# Patient Record
Sex: Female | Born: 1960 | Race: White | Hispanic: No | Marital: Married | Smoking: Former smoker
Health system: Southern US, Community
[De-identification: ages and names within clinical notes are randomized; demographics above are authoritative.]

## PROBLEM LIST (undated history)

## (undated) DIAGNOSIS — A389 Scarlet fever, uncomplicated: Secondary | ICD-10-CM

## (undated) DIAGNOSIS — Z460 Encounter for fitting and adjustment of spectacles and contact lenses: Secondary | ICD-10-CM

## (undated) DIAGNOSIS — E079 Disorder of thyroid, unspecified: Secondary | ICD-10-CM

## (undated) DIAGNOSIS — I10 Essential (primary) hypertension: Secondary | ICD-10-CM

## (undated) DIAGNOSIS — K219 Gastro-esophageal reflux disease without esophagitis: Secondary | ICD-10-CM

## (undated) DIAGNOSIS — M87243 Osteonecrosis due to previous trauma, unspecified hand: Secondary | ICD-10-CM

## (undated) DIAGNOSIS — E042 Nontoxic multinodular goiter: Secondary | ICD-10-CM

## (undated) HISTORY — DX: Essential (primary) hypertension: I10

## (undated) HISTORY — DX: Nontoxic multinodular goiter: E04.2

## (undated) HISTORY — DX: Scarlet fever, uncomplicated: A38.9

## (undated) HISTORY — DX: Gastro-esophageal reflux disease without esophagitis: K21.9

## (undated) HISTORY — DX: Disorder of thyroid, unspecified: E07.9

## (undated) HISTORY — DX: Osteonecrosis due to previous trauma, unspecified hand: M87.243

---

## 1999-02-26 ENCOUNTER — Encounter: Admission: RE | Admit: 1999-02-26 | Discharge: 1999-02-26 | Payer: Self-pay | Admitting: Family Medicine

## 1999-02-26 ENCOUNTER — Encounter: Payer: Self-pay | Admitting: Family Medicine

## 2001-01-22 ENCOUNTER — Ambulatory Visit (HOSPITAL_COMMUNITY): Admission: RE | Admit: 2001-01-22 | Discharge: 2001-01-22 | Payer: Self-pay | Admitting: Cardiology

## 2001-01-22 ENCOUNTER — Encounter: Payer: Self-pay | Admitting: Cardiology

## 2001-02-19 ENCOUNTER — Ambulatory Visit (HOSPITAL_COMMUNITY): Admission: RE | Admit: 2001-02-19 | Discharge: 2001-02-19 | Payer: Self-pay | Admitting: Cardiology

## 2001-02-19 ENCOUNTER — Encounter: Payer: Self-pay | Admitting: Cardiology

## 2003-11-14 ENCOUNTER — Encounter: Admission: RE | Admit: 2003-11-14 | Discharge: 2003-11-14 | Payer: Self-pay | Admitting: Family Medicine

## 2004-06-11 ENCOUNTER — Ambulatory Visit: Payer: Self-pay | Admitting: Family Medicine

## 2004-08-16 ENCOUNTER — Encounter: Admission: RE | Admit: 2004-08-16 | Discharge: 2004-08-16 | Payer: Self-pay | Admitting: Endocrinology

## 2004-12-10 ENCOUNTER — Ambulatory Visit: Payer: Self-pay | Admitting: Family Medicine

## 2005-02-14 ENCOUNTER — Encounter: Admission: RE | Admit: 2005-02-14 | Discharge: 2005-02-14 | Payer: Self-pay | Admitting: Endocrinology

## 2005-06-10 ENCOUNTER — Ambulatory Visit: Payer: Self-pay | Admitting: Family Medicine

## 2006-07-21 ENCOUNTER — Ambulatory Visit: Payer: Self-pay | Admitting: Family Medicine

## 2006-07-21 LAB — CONVERTED CEMR LAB
BUN: 11 mg/dL (ref 6–23)
Basophils Absolute: 0 10*3/uL (ref 0.0–0.1)
Chloride: 105 meq/L (ref 96–112)
Eosinophils Relative: 2 % (ref 0.0–5.0)
Lymphocytes Relative: 20.5 % (ref 12.0–46.0)
MCHC: 34.4 g/dL (ref 30.0–36.0)
MCV: 94.8 fL (ref 78.0–100.0)
Monocytes Absolute: 0.8 10*3/uL — ABNORMAL HIGH (ref 0.2–0.7)
Neutro Abs: 4.1 10*3/uL (ref 1.4–7.7)
RBC: 4.57 M/uL (ref 3.87–5.11)
Sodium: 142 meq/L (ref 135–145)
Total CHOL/HDL Ratio: 4.2
Triglycerides: 85 mg/dL (ref 0–149)

## 2006-07-31 ENCOUNTER — Encounter: Admission: RE | Admit: 2006-07-31 | Discharge: 2006-07-31 | Payer: Self-pay | Admitting: Family Medicine

## 2006-09-03 HISTORY — PX: TOTAL THYROIDECTOMY: SHX2547

## 2006-09-07 ENCOUNTER — Telehealth (INDEPENDENT_AMBULATORY_CARE_PROVIDER_SITE_OTHER): Payer: Self-pay | Admitting: *Deleted

## 2006-09-19 ENCOUNTER — Ambulatory Visit (HOSPITAL_COMMUNITY): Admission: RE | Admit: 2006-09-19 | Discharge: 2006-09-20 | Payer: Self-pay | Admitting: General Surgery

## 2006-09-19 ENCOUNTER — Encounter (INDEPENDENT_AMBULATORY_CARE_PROVIDER_SITE_OTHER): Payer: Self-pay | Admitting: General Surgery

## 2006-11-03 HISTORY — PX: WRIST SURGERY: SHX841

## 2006-11-22 ENCOUNTER — Other Ambulatory Visit: Payer: Self-pay | Admitting: Orthopedic Surgery

## 2006-11-23 ENCOUNTER — Other Ambulatory Visit: Payer: Self-pay | Admitting: Orthopedic Surgery

## 2006-11-23 ENCOUNTER — Encounter (INDEPENDENT_AMBULATORY_CARE_PROVIDER_SITE_OTHER): Payer: Self-pay | Admitting: Internal Medicine

## 2006-11-23 ENCOUNTER — Encounter (INDEPENDENT_AMBULATORY_CARE_PROVIDER_SITE_OTHER): Payer: Self-pay | Admitting: Orthopedic Surgery

## 2006-11-24 ENCOUNTER — Observation Stay (HOSPITAL_COMMUNITY): Admission: RE | Admit: 2006-11-24 | Discharge: 2006-11-25 | Payer: Self-pay | Admitting: Orthopedic Surgery

## 2006-11-24 ENCOUNTER — Other Ambulatory Visit: Payer: Self-pay | Admitting: Orthopedic Surgery

## 2006-11-28 ENCOUNTER — Ambulatory Visit: Payer: Self-pay | Admitting: Family Medicine

## 2006-11-29 ENCOUNTER — Encounter (INDEPENDENT_AMBULATORY_CARE_PROVIDER_SITE_OTHER): Payer: Self-pay | Admitting: Internal Medicine

## 2006-11-29 LAB — CONVERTED CEMR LAB
BUN: 8 mg/dL (ref 6–23)
CO2: 34 meq/L — ABNORMAL HIGH (ref 19–32)
Calcium: 9.1 mg/dL (ref 8.4–10.5)
Chloride: 101 meq/L (ref 96–112)
Creatinine, Ser: 0.9 mg/dL (ref 0.4–1.2)
GFR calc Af Amer: 87 mL/min
GFR calc non Af Amer: 72 mL/min
Glucose, Bld: 94 mg/dL (ref 70–99)
Sodium: 144 meq/L (ref 135–145)

## 2007-06-07 ENCOUNTER — Telehealth (INDEPENDENT_AMBULATORY_CARE_PROVIDER_SITE_OTHER): Payer: Self-pay | Admitting: Internal Medicine

## 2008-06-05 ENCOUNTER — Telehealth (INDEPENDENT_AMBULATORY_CARE_PROVIDER_SITE_OTHER): Payer: Self-pay | Admitting: Internal Medicine

## 2008-06-14 IMAGING — CR DG CHEST 2V
2 series · 2 of 2 positions shown · non-contrast
Comparison: None available.

CLINICAL DATA: 46-year-old female preop evaluation for multinodular thyroid.  
 CHEST - 2 VIEW:

[w chest pa]
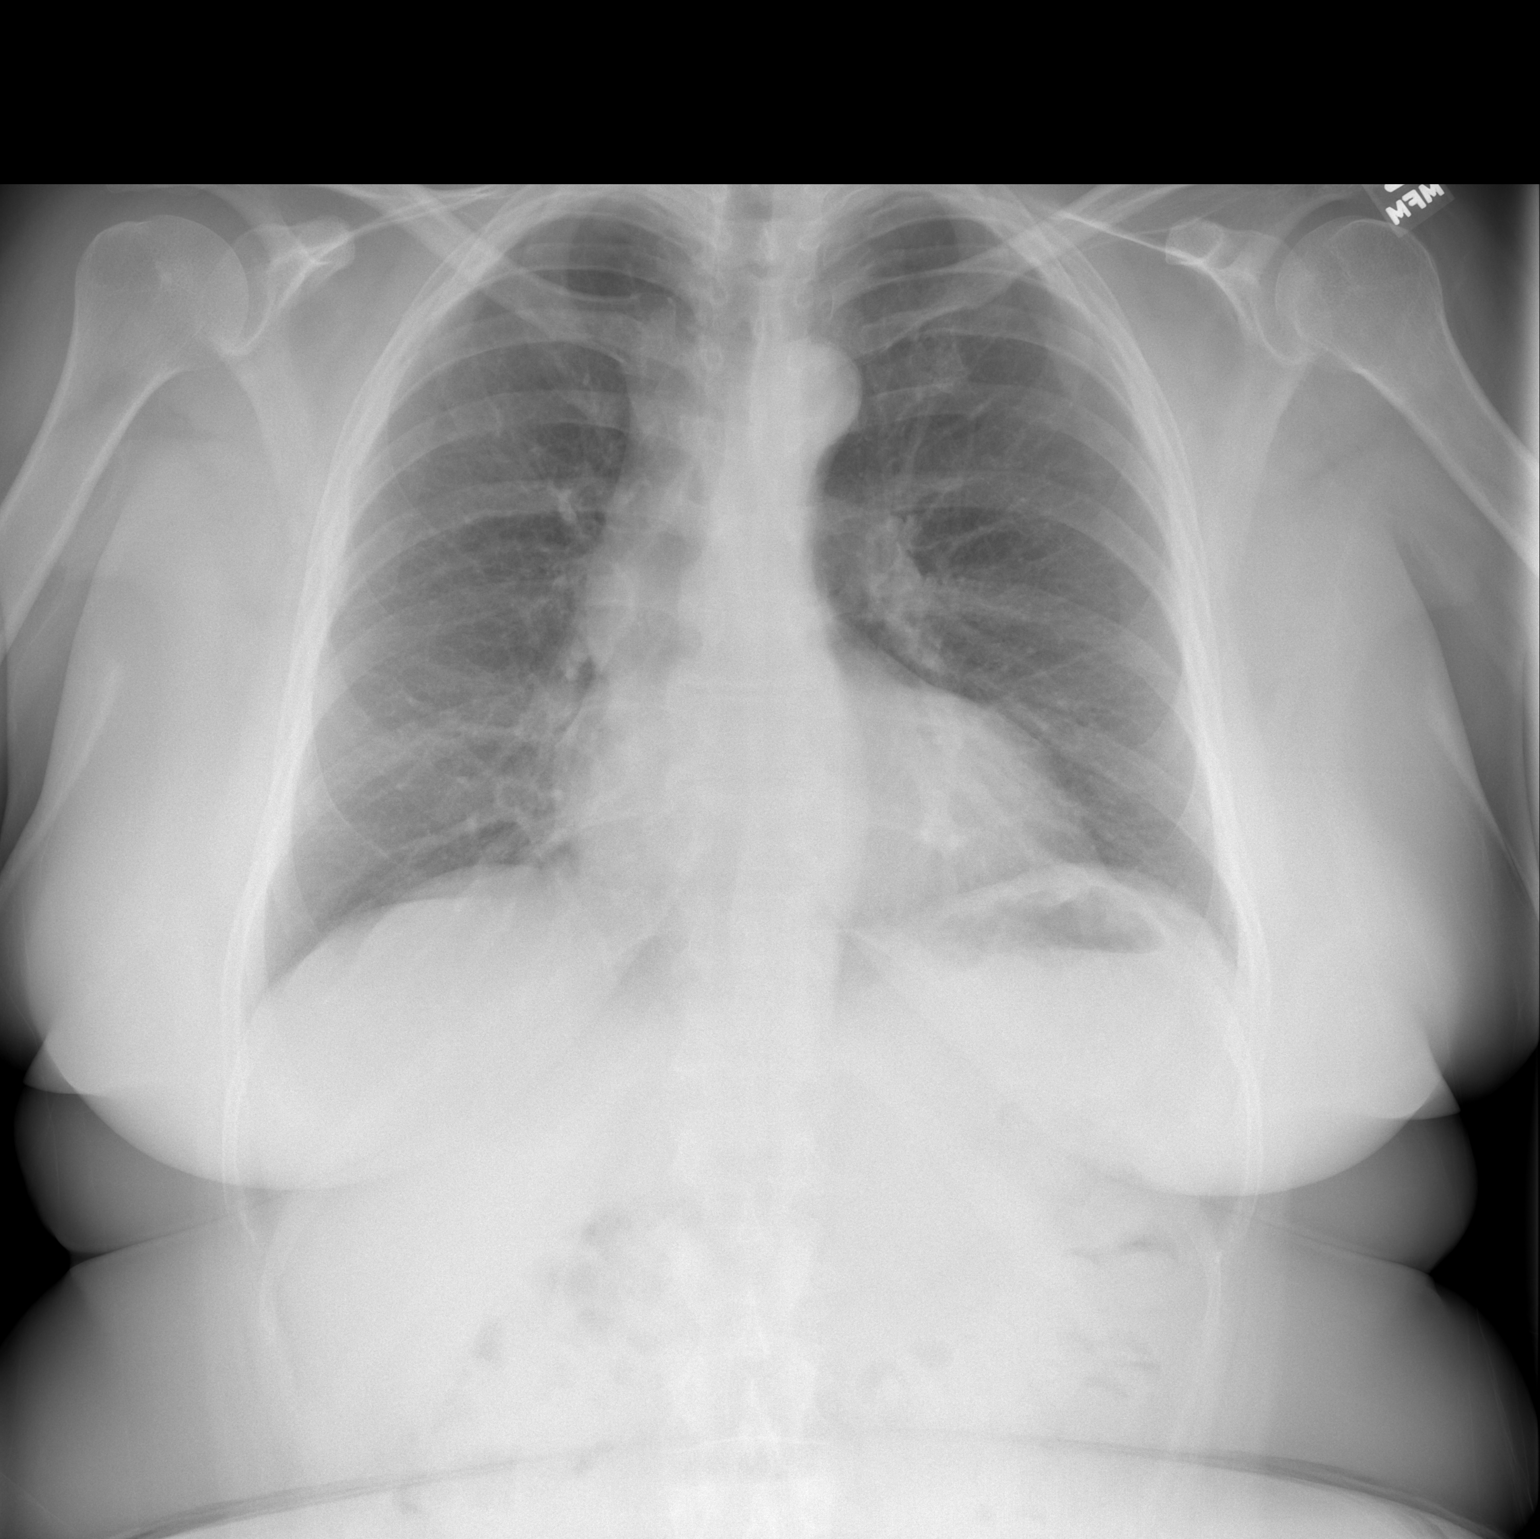

[w chest lat]
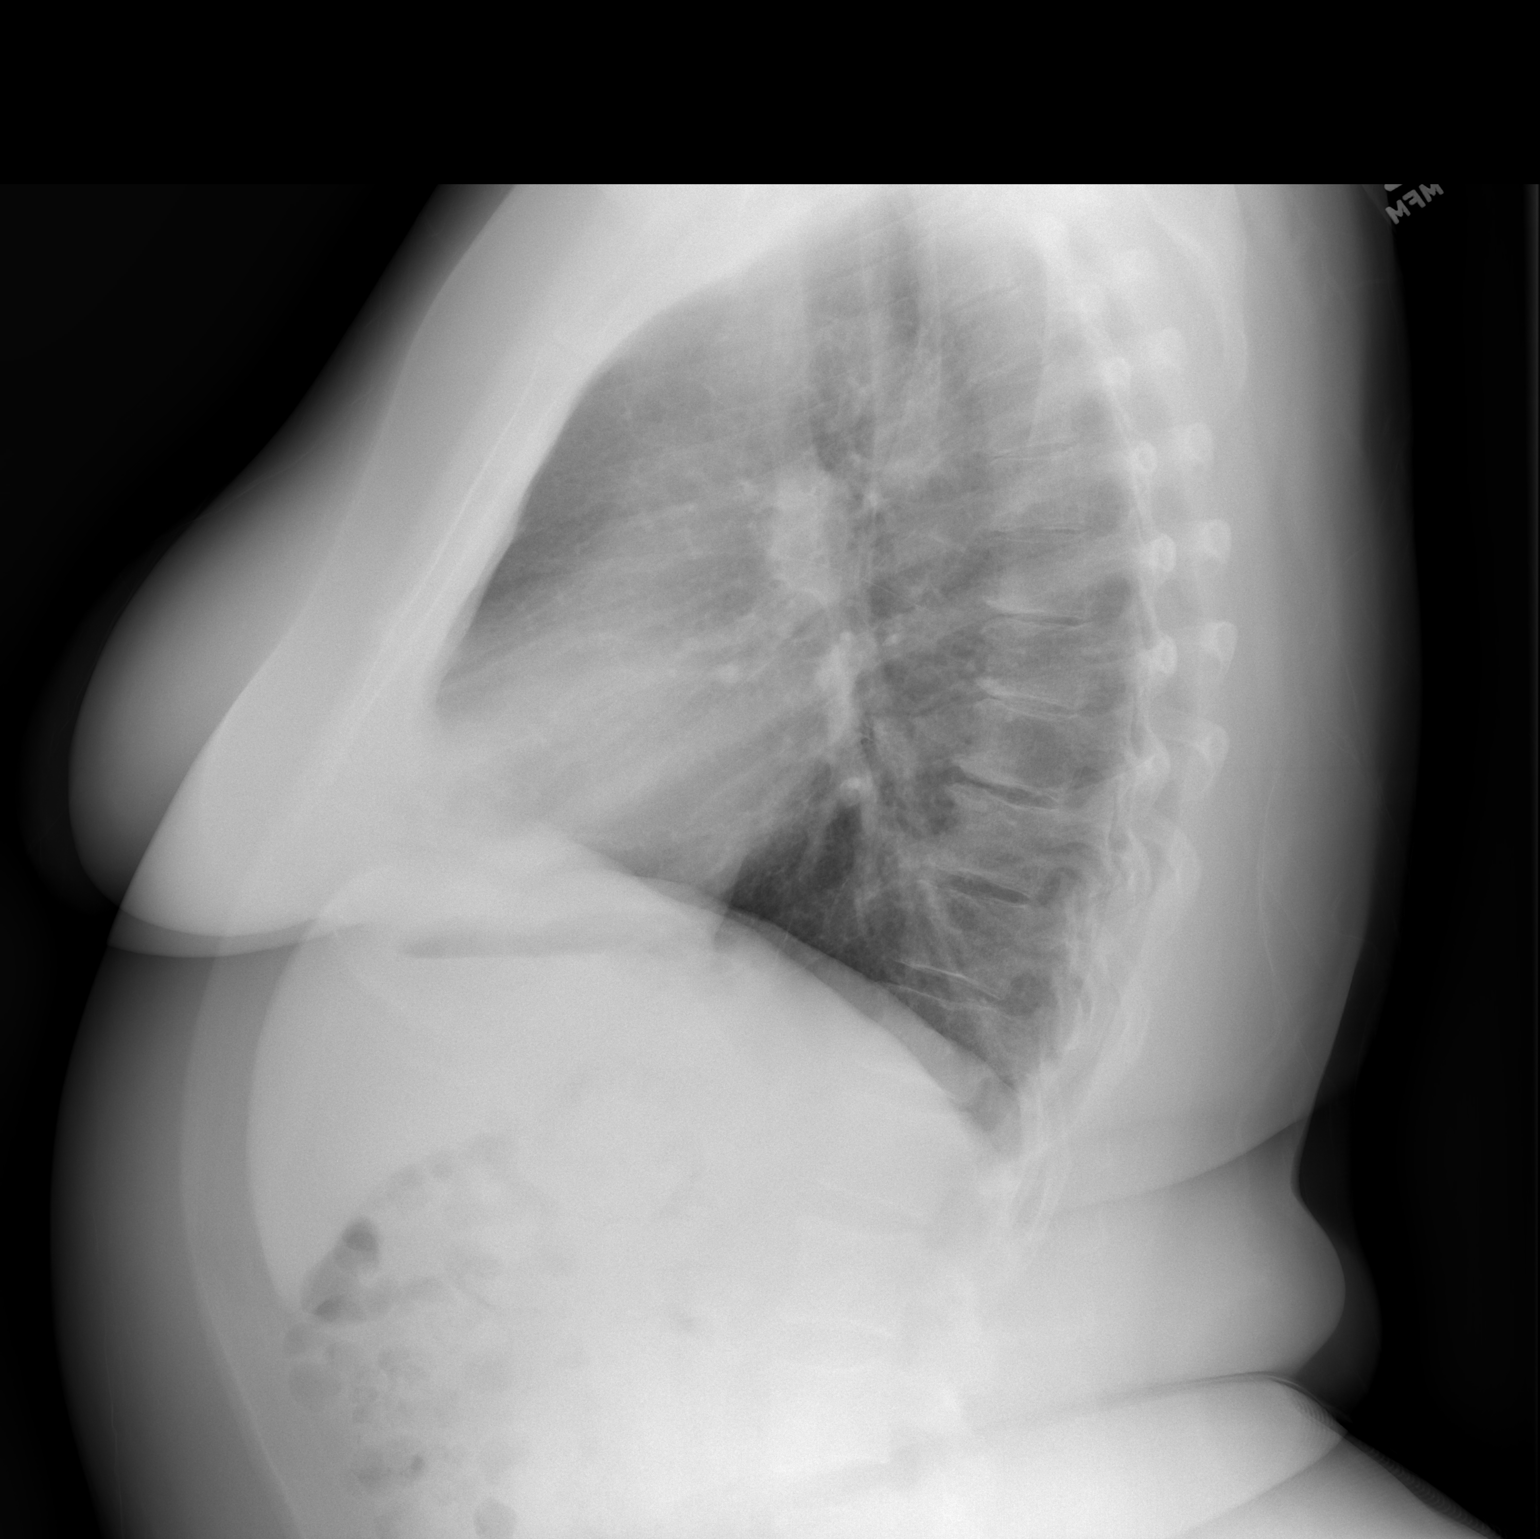

[2 of 2 positions shown; findings below may reference images not displayed]

FINDINGS: Normal heart size.  No acute pneumonia, CHF, effusion or pneumothorax.  Right paratracheal prominence is likely related to rotation and vascularity.  
 Thoracic spondylosis is noted.
IMPRESSION: No acute chest process.

## 2008-06-18 ENCOUNTER — Ambulatory Visit: Payer: Self-pay | Admitting: Family Medicine

## 2008-06-18 DIAGNOSIS — I10 Essential (primary) hypertension: Secondary | ICD-10-CM | POA: Insufficient documentation

## 2008-07-18 ENCOUNTER — Encounter (INDEPENDENT_AMBULATORY_CARE_PROVIDER_SITE_OTHER): Payer: Self-pay | Admitting: Internal Medicine

## 2008-07-18 ENCOUNTER — Other Ambulatory Visit: Admission: RE | Admit: 2008-07-18 | Discharge: 2008-07-18 | Payer: Self-pay | Admitting: Family Medicine

## 2008-07-18 ENCOUNTER — Ambulatory Visit: Payer: Self-pay | Admitting: Family Medicine

## 2008-07-18 DIAGNOSIS — E669 Obesity, unspecified: Secondary | ICD-10-CM | POA: Insufficient documentation

## 2008-08-13 ENCOUNTER — Ambulatory Visit: Payer: Self-pay | Admitting: Gastroenterology

## 2008-08-22 ENCOUNTER — Ambulatory Visit: Payer: Self-pay | Admitting: Internal Medicine

## 2008-08-26 LAB — CONVERTED CEMR LAB: Potassium: 3.7 meq/L (ref 3.5–5.3)

## 2008-08-27 ENCOUNTER — Ambulatory Visit: Payer: Self-pay | Admitting: Gastroenterology

## 2008-08-27 ENCOUNTER — Encounter: Payer: Self-pay | Admitting: Gastroenterology

## 2008-08-28 ENCOUNTER — Encounter: Payer: Self-pay | Admitting: Gastroenterology

## 2008-08-29 ENCOUNTER — Encounter: Admission: RE | Admit: 2008-08-29 | Discharge: 2008-08-29 | Payer: Self-pay | Admitting: Family Medicine

## 2008-09-03 ENCOUNTER — Encounter (INDEPENDENT_AMBULATORY_CARE_PROVIDER_SITE_OTHER): Payer: Self-pay | Admitting: *Deleted

## 2008-10-08 ENCOUNTER — Telehealth (INDEPENDENT_AMBULATORY_CARE_PROVIDER_SITE_OTHER): Payer: Self-pay | Admitting: Internal Medicine

## 2008-10-20 ENCOUNTER — Ambulatory Visit: Payer: Self-pay | Admitting: Gastroenterology

## 2008-11-07 ENCOUNTER — Ambulatory Visit: Payer: Self-pay | Admitting: Family Medicine

## 2008-11-20 ENCOUNTER — Telehealth: Payer: Self-pay | Admitting: Gastroenterology

## 2009-02-25 ENCOUNTER — Ambulatory Visit: Payer: Self-pay | Admitting: Family Medicine

## 2009-02-25 LAB — CONVERTED CEMR LAB
BUN: 10 mg/dL (ref 6–23)
CO2: 31 meq/L (ref 19–32)
Calcium: 8.8 mg/dL (ref 8.4–10.5)
Chloride: 102 meq/L (ref 96–112)
Cholesterol: 184 mg/dL (ref 0–200)
Creatinine, Ser: 0.9 mg/dL (ref 0.4–1.2)
GFR calc non Af Amer: 71.09 mL/min (ref >60)
Glucose, Bld: 95 mg/dL (ref 70–99)
HDL: 43.5 mg/dL (ref >39.00)
LDL Cholesterol: 126 mg/dL — ABNORMAL HIGH (ref 0–99)
Potassium: 3.5 meq/L (ref 3.5–5.1)
Sodium: 142 meq/L (ref 135–145)
Total CHOL/HDL Ratio: 4
Triglycerides: 73 mg/dL (ref 0.0–149.0)
VLDL: 14.6 mg/dL (ref 0.0–40.0)

## 2009-09-03 ENCOUNTER — Ambulatory Visit: Payer: Self-pay | Admitting: Family Medicine

## 2009-09-03 DIAGNOSIS — R5381 Other malaise: Secondary | ICD-10-CM

## 2009-09-03 DIAGNOSIS — R5383 Other fatigue: Secondary | ICD-10-CM

## 2009-09-03 DIAGNOSIS — K219 Gastro-esophageal reflux disease without esophagitis: Secondary | ICD-10-CM

## 2009-09-03 DIAGNOSIS — E039 Hypothyroidism, unspecified: Secondary | ICD-10-CM | POA: Insufficient documentation

## 2009-09-03 LAB — CONVERTED CEMR LAB
ALT: 20 units/L (ref 0–35)
BUN: 15 mg/dL (ref 6–23)
Basophils Absolute: 0 10*3/uL (ref 0.0–0.1)
Basophils Relative: 0.7 % (ref 0.0–3.0)
Bilirubin, Direct: 0.2 mg/dL (ref 0.0–0.3)
CO2: 33 meq/L — ABNORMAL HIGH (ref 19–32)
Calcium: 9.1 mg/dL (ref 8.4–10.5)
Chloride: 103 meq/L (ref 96–112)
Creatinine, Ser: 0.9 mg/dL (ref 0.4–1.2)
Eosinophils Relative: 6.4 % — ABNORMAL HIGH (ref 0.0–5.0)
Glucose, Bld: 93 mg/dL (ref 70–99)
Hemoglobin: 14.3 g/dL (ref 12.0–15.0)
MCV: 96.2 fL (ref 78.0–100.0)
Neutro Abs: 3.6 10*3/uL (ref 1.4–7.7)
Platelets: 341 10*3/uL (ref 150.0–400.0)
Potassium: 4 meq/L (ref 3.5–5.1)
Total Bilirubin: 0.8 mg/dL (ref 0.3–1.2)
Total Protein: 6.8 g/dL (ref 6.0–8.3)

## 2010-04-07 ENCOUNTER — Ambulatory Visit
Admission: RE | Admit: 2010-04-07 | Discharge: 2010-04-07 | Payer: Self-pay | Source: Home / Self Care | Attending: Family Medicine | Admitting: Family Medicine

## 2010-04-21 ENCOUNTER — Ambulatory Visit
Admission: RE | Admit: 2010-04-21 | Discharge: 2010-04-21 | Payer: Self-pay | Source: Home / Self Care | Attending: Family Medicine | Admitting: Family Medicine

## 2010-04-21 ENCOUNTER — Other Ambulatory Visit: Payer: Self-pay | Admitting: Family Medicine

## 2010-04-22 LAB — BASIC METABOLIC PANEL
BUN: 21 mg/dL (ref 6–23)
CO2: 29 mEq/L (ref 19–32)
Calcium: 8.8 mg/dL (ref 8.4–10.5)
Chloride: 98 mEq/L (ref 96–112)
Creatinine, Ser: 0.8 mg/dL (ref 0.4–1.2)
GFR: 83.45 mL/min (ref >60.00)
Glucose, Bld: 79 mg/dL (ref 70–99)
Potassium: 3.7 mEq/L (ref 3.5–5.1)
Sodium: 135 mEq/L (ref 135–145)

## 2010-04-22 LAB — LDL CHOLESTEROL, DIRECT: Direct LDL: 122.4 mg/dL

## 2010-04-27 ENCOUNTER — Encounter
Admission: RE | Admit: 2010-04-27 | Discharge: 2010-04-27 | Payer: Self-pay | Source: Home / Self Care | Attending: Family Medicine | Admitting: Family Medicine

## 2010-04-27 LAB — HM MAMMOGRAPHY

## 2010-04-29 ENCOUNTER — Encounter (INDEPENDENT_AMBULATORY_CARE_PROVIDER_SITE_OTHER): Payer: Self-pay | Admitting: *Deleted

## 2010-05-02 LAB — CONVERTED CEMR LAB
ALT: 17 units/L (ref 0–35)
BUN: 14 mg/dL (ref 6–23)
Basophils Absolute: 0 10*3/uL (ref 0.0–0.1)
Basophils Relative: 1 % (ref 0–1)
Chloride: 100 meq/L (ref 96–112)
Cholesterol: 177 mg/dL (ref 0–200)
Eosinophils Absolute: 0.1 10*3/uL (ref 0.0–0.7)
Eosinophils Relative: 1 % (ref 0–5)
HDL: 46 mg/dL (ref >39)
MCV: 93.3 fL (ref 78.0–100.0)
Monocytes Absolute: 1 10*3/uL (ref 0.1–1.0)
Monocytes Relative: 11 % (ref 3–12)
Platelets: 402 10*3/uL — ABNORMAL HIGH (ref 150–400)
Potassium: 3.4 meq/L — ABNORMAL LOW (ref 3.5–5.3)
Sodium: 140 meq/L (ref 135–145)
Total Bilirubin: 0.6 mg/dL (ref 0.3–1.2)
VLDL: 21 mg/dL (ref 0–40)
WBC: 8.8 10*3/uL (ref 4.0–10.5)

## 2010-05-04 NOTE — Assessment & Plan Note (Signed)
Summary: F/UP / LFW R/S FROM 08/31/09   Vital Signs:  Patient profile:   50 year old [place] Height:      61 inches Weight:      199.4 pounds BMI:     37.81 Temp:     98.4 degrees F oral Pulse rate:   60 / minute Pulse rhythm:   regular BP sitting:   100 / 70  (left arm) Cuff size:   regular  Vitals Entered By: Benny Lennert CMA Duncan Dull) (September 03, 2009 8:39 AM)  History of Present Illness: Chief complaint 6 month follow up  Hypothyroid: seeing endocrinology  HTN: stable on current meds, no se  GERD: rare kapidex use, doing fine  mammography - first one last, going to schedule upcoming  Ankle pain, multiyear history, some swelling, on feet 50+ hours a day    Allergies: 1)  * Penicillin 2)  * Sulfa  Past History:  Past medical, surgical, family and social histories (including risk factors) reviewed, and no changes noted (except as noted below).  Past Medical History: Hypertension Scarlet fever as a child GOITER, NONTOXIC MULTINODULAR (ICD-241.1) Hypothyroidism GERD  Past Surgical History: Reviewed history from 08/13/2008 and no changes required. 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 Adela Lank  Family History: Reviewed history from 08/13/2008 and no changes required. Family History of Heart Disease: Mother No FH of Colon Cancer: Family History of Diabetes: Grandmother MI: Mother Lung Cancer: Aunt  Social History: Reviewed history from 08/13/2008 and no changes required. 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  Review of Systems       REVIEW OF SYSTEMS GEN: No acute illnesses, no fever, chills, sweats. CV: No chest pain or SOB GI: No noted N or V Otherwise, pertinent positives and  negatives are noted in the HPI.   Physical Exam  General:  Well-developed,well-nourished,in no acute distress; alert,appropriate and cooperative throughout examination Head:  Normocephalic and atraumatic without obvious abnormalities. No apparent alopecia or balding. Ears:  no external deformities.   Nose:  no external deformity.   Mouth:  Oral mucosa and oropharynx without lesions or exudates.  Teeth in good repair. Neck:  No deformities, masses, or tenderness noted. Lungs:  Normal respiratory effort, chest expands symmetrically. Lungs are clear to auscultation, no crackles or wheezes. Heart:  Normal rate and regular rhythm. S1 and S2 normal without gallop, murmur, click, rub or other extra sounds. Neurologic:  alert & oriented X3 and gait normal.   Cervical Nodes:  No lymphadenopathy noted Psych:  Cognition and judgment appear intact. Alert and cooperative with normal attention span and concentration. No apparent delusions, illusions, hallucinations   Foot/Ankle Exam  Gait:    Normal heel-toe gait pattern bilaterally.    Inspection:    minimal lateral swelling  Palpation:    non-tender to palpation over distal leg, medial and lateral ankle, distal achilles tendon, medial and lateral hindfoot, posterior heel, plantar heel, midfoot and forefoot bilaterally.    Impression & Recommendations:  Problem # 1:  HYPERTENSION (ICD-401.9) Assessment Unchanged  Her updated medication list for this problem includes:    Furosemide 20 Mg Tabs (Furosemide) ..... One by mouth daily    Metoprolol Tartrate 50 Mg Tabs (Metoprolol tartrate) .Marland Kitchen... 1 two times a day  for bp    Diovan Hct 160-12.5 Mg Tabs (Valsartan-hydrochlorothiazide) .Marland Kitchen... Take 1 each morning for bp  BP today: 100/70 Prior BP: 102/64 (02/25/2009)  Labs Reviewed: K+: 3.5 (02/25/2009) Creat: : 0.9 (02/25/2009)   Chol: 184 (02/25/2009)   HDL: 43.50 (02/25/2009)   LDL: 126 (02/25/2009)   TG: 73.0 (02/25/2009)  Problem # 2:  GERD  (ICD-530.81) Assessment: Unchanged  Her updated medication list for this problem includes:    Kapidex 60 Mg Cpdr (Dexlansoprazole) ..... One tablet by mouth once daily as needed  Problem # 3:  HYPOTHYROIDISM (ICD-244.9)  Her updated medication list for this problem includes:    Synthroid 112 Mcg Tabs (Levothyroxine sodium) .Marland Kitchen... Take one by mouth daily  Problem # 4:  ANKLE, PAIN (ICD-719.47) multiyear hist, s/p xr, mri with no significant derangement nonfocal exam basic cushioning as below  Complete Medication List: 1)  Furosemide 20 Mg Tabs (Furosemide) .... One by mouth daily 2)  Metoprolol Tartrate 50 Mg Tabs (Metoprolol tartrate) .Marland Kitchen.. 1 two times a day for bp 3)  Synthroid 112 Mcg Tabs (Levothyroxine sodium) .... Take one by mouth daily 4)  Kapidex 60 Mg Cpdr (Dexlansoprazole) .... One tablet by mouth once daily as needed 5)  Diovan Hct 160-12.5 Mg Tabs (Valsartan-hydrochlorothiazide) .... Take 1 each morning for bp 6)  Tylenol 325 Mg Tabs (Acetaminophen) .... Otc as directed.  Other Orders: Venipuncture ([address]) TLB-BMP (Basic Metabolic Panel-BMET) (80048-METABOL) TLB-CBC Platelet - w/Differential (85025-CBCD) TLB-Hepatic/Liver Function Pnl (80076-HEPATIC) T-Vitamin D (Tiffany-Hydroxy) (60454-09811)  Patient Instructions: 1)  Excellent Over the Counter Orthotics: 2)  Hapad: available at www.hapad.com 3)  SPENCO: Available at some sports stores or www.amazon.com 4)  SHOES: Danskos, Merrells, Keens, Clarks - good arch support, want minimal bendability 5)  Shoes: Birkenstock shoes, [employer] 6)  ***[employer], [address] [place], [place]***  7)  f/u 6 mo for CPX Prescriptions: DIOVAN HCT 160-12.5 MG TABS (VALSARTAN-HYDROCHLOROTHIAZIDE) take 1 each morning for BP  #30 Tablet x 11   Entered and Authorized by:   Hannah Beat MD   Signed by:   Hannah Beat MD on 09/03/2009   Method used:   Electronically to        [facility] [address] [id number]       [address]       [place], [place]  [address]       Ph: [phone]       Fax: [phone]   RxID:   [id number] METOPROLOL TARTRATE 50 MG  TABS (METOPROLOL TARTRATE) 1 two times a day for BP  #60 Tablet x 11   Entered and Authorized by:   Hannah Beat MD   Signed by:   Hannah Beat MD on 09/03/2009   Method used:   [facility] to        [facility] [address] [phone][facility] (retail)       [address]       [place], [place]  [address]       Ph: [phone]       Fax: [phone]   RxID:   [id number] FUROSEMIDE 20 MG TABS (FUROSEMIDE) one by mouth daily  #30 Tablet x 11   Entered and Authorized by:   Hannah Beat MD   Signed by:   Hannah Beat MD on 09/03/2009   Method used:   [facility]  [facility] [address] [id number]       [address]       [place], [place]  [address]       Ph: [phone]       Fax: [phone]   RxID:   [id number]   Current Allergies (reviewed today): * PENICILLIN * SULFA  Appended Document: F/UP / LFW R/S FROM 08/31/09

## 2010-05-06 NOTE — Letter (Signed)
Summary: Results Follow up Letter  [place] [facility]  [address] [place], [place] [address]   Phone: [phone]  Fax: [phone]    04/29/2010 MRN: [id number]     Gesselle Jablonowski [address] [place], [place]  [address]    Dear Ms. Barry Dienes,  The following are the results of your recent test(s):  Test         Result    Pap Smear:        Normal _____  Not Normal _____ Comments: ______________________________________________________ Cholesterol: LDL(Bad cholesterol):         Your goal is less than:         HDL (Good cholesterol):       Your goal is more than: Comments:  ______________________________________________________ Mammogram:        Normal __x___  Not Normal _____ Comments:Repeat in 1 year  ___________________________________________________________________ Hemoccult:        Normal _____  Not normal _______ Comments:    _____________________________________________________________________ Other Tests:    We routinely do not discuss normal results over the telephone.  If you desire a copy of the results, or you have any questions about this information we can discuss them at your next office visit.   Sincerely,  Hannah Beat MD

## 2010-05-06 NOTE — Assessment & Plan Note (Signed)
Summary: HEAD STOPPED UP, COUGH, HEADACHES / LFW   Vital Signs:  Patient profile:   50 year old female Weight:      205.50 pounds O2 Sat:      99 % on Room air Temp:     99.4 degrees F oral Pulse rate:   60 / minute Pulse rhythm:   regular Resp:     20 per minute BP sitting:   108 / 70  (left arm) Cuff size:   large  Vitals Entered By: Sydell Axon LPN (April 07, 2010 2:36 PM)  O2 Flow:  Room air CC: Head is stopped up, deep cough and headache   History of Present Illness: Pt here foir acute dz. She has had warmth and chills a few days ago that is now improved. She has hewadache in the top of the head. Cough worsens this. No ear pain, some rhinitis that is continuous and clear with min yellow and some sneezing that has improved, no ST now, did hurt with coughing but better, now coughing some with proiductive yellow sputum. She has taken ALKA Selzer Cold and Cough, Aleve and Halls, Mucinex last night and today.  Allergies: 1)  * Penicillin 2)  * Sulfa  Physical Exam  General:  Well-developed,well-nourished,in no acute distress; alert,appropriate and cooperative throughout examination, mildly congested and mildly hoarse. Head:  Normocephalic and atraumatic without obvious abnormalities. Sinuses NT. Eyes:  Conjunctiva clear bilaterally.  Ears:  External ear exam shows no significant lesions or deformities.  Otoscopic examination reveals clear canals, tympanic membranes are intact bilaterally without bulging, retraction, inflammation or discharge. Hearing is grossly normal bilaterally. Nose:  External nasal examination shows no deformity or inflammation. Nasal mucosa are pink and moist without lesions or exudates. Mild swelling with clear discharge bilat. Mouth:  Oral mucosa and oropharynx without lesions or exudates.  Teeth in good repair. Min clear PND. Neck:  No deformities, masses, or tenderness noted. Lungs:  Normal respiratory effort, chest expands symmetrically. Lungs are  clear to auscultation, no crackles or wheezes. Heart:  Normal rate and regular rhythm. S1 and S2 normal without gallop, murmur, click, rub or other extra sounds.   Impression & Recommendations:  Problem # 1:  URI (ICD-465.9) Assessment New  See instructions. Her updated medication list for this problem includes:    Tylenol 325 Mg Tabs (Acetaminophen) ..... Otc as directed.  Instructed on symptomatic treatment. Call if symptoms persist or worsen.   Complete Medication List: 1)  Furosemide 20 Mg Tabs (Furosemide) .... One by mouth daily 2)  Metoprolol Tartrate 50 Mg Tabs (Metoprolol tartrate) .Marland Kitchen.. 1 two times a day for bp 3)  Synthroid 112 Mcg Tabs (Levothyroxine sodium) .... Take one by mouth daily 4)  Kapidex 60 Mg Cpdr (Dexlansoprazole) .... One tablet by mouth once daily as needed 5)  Diovan Hct 160-12.5 Mg Tabs (Valsartan-hydrochlorothiazide) .... Take 1 each morning for bp 6)  Tylenol 325 Mg Tabs (Acetaminophen) .... Otc as directed. 7)  Vitamin D (ergocalciferol) 50000 Unit Caps (Ergocalciferol) .... Take one tablet once weekly 8)  Zithromax Z-pak 250 Mg Tabs (Azithromycin) .... As dir  Patient Instructions: 1)  Take Guaifenesin by [facility] or [facility] and getting MUCOUS RELIEF EXPECTORANT (400mg ), take 11/2 tabs by mouth AM and NOON. 2)  Drink lots of fluids anytime taking Guaifenesin.  3)  Take Aleve 2 tabs after brfst and supper. 4)  Use Vicks in the nose and on the chest at night. 5)  Gargle  with ewarm salt water every 1/2 hr for 2 days. 6)  If no better, take Zithromax as dir. Prescriptions: ZITHROMAX Z-PAK 250 MG TABS (AZITHROMYCIN) as dir  #1 pak x 0   Entered and Authorized by:   Shaune Leeks MD   Signed by:   Shaune Leeks MD on 04/07/2010   Method used:   Print then Give to Patient   RxID:   [id number]    Orders Added: 1)  Est. Patient Level III [[address]]    Current Allergies (reviewed today): * PENICILLIN *  SULFA

## 2010-05-06 NOTE — Assessment & Plan Note (Signed)
Summary: CPX WITH PAP/RBH   Vital Signs:  Patient profile:   50 year old female Height:      61 inches Weight:      204.50 pounds BMI:     38.78 Temp:     98.0 degrees F oral Pulse rate:   64 / minute Pulse rhythm:   regular BP sitting:   100 / 60  (left arm) Cuff size:   large  Vitals Entered By: Benny Lennert CMA Duncan Dull) (April 21, 2010 2:33 PM)  History of Present Illness: Chief complaint cpx  BP stable  Pap, breast - pap normal, last done 07/2008, no irregulars.  Hypothyroid: Dr. Juleen China has been seeing and checking her thyroid.  flu shot there.  mammogram  hair is getting thinner    Preventive Screening-Counseling & Management  Alcohol-Tobacco     Alcohol Counseling: not indicated; use of alcohol is not excessive or problematic     Smoking Status: quit     Tobacco Counseling: to remain off tobacco products  Caffeine-Diet-Exercise     Diet Counseling: to improve diet; diet is suboptimal     Does Patient Exercise: no     Exercise Counseling: to improve exercise regimen  Hep-HIV-STD-Contraception     HIV Risk: no risk noted     STD Risk: no risk noted     Dental Visit-last 6 months no     Dental Care Counseling: to seek dental care; no dental care within six months     SBE Education/Counseling: to perform regular SBE      Sexual History:  currently monogamous.        Drug Use:  never.    Clinical Review Panels:  Prevention   Last Mammogram:  ASSESSMENT: Negative - BI-RADS 1^MM DIGITAL SCREENING (08/29/2008)   Last Pap Smear:  NEGATIVE FOR INTRAEPITHELIAL LESIONS OR MALIGNANCY. (07/18/2008)  Immunizations   Last Tetanus Booster:  Td (11/27/2000)   Last Flu Vaccine:  Fluvax 3+ (02/25/2009)  Lipid Management   Cholesterol:  184 (02/25/2009)   LDL (bad choesterol):  126 (02/25/2009)   HDL (good cholesterol):  43.50 (02/25/2009)  Diabetes Management   Creatinine:  0.9 (09/03/2009)   Last Flu Vaccine:  Fluvax 3+ (02/25/2009)  CBC   WBC:  5.7  (09/03/2009)   RBC:  4.30 (09/03/2009)   Hgb:  14.3 (09/03/2009)   Hct:  41.3 (09/03/2009)   Platelets:  341.0 (09/03/2009)   MCV  96.2 (09/03/2009)   MCHC  34.6 (09/03/2009)   RDW  12.9 (09/03/2009)   PMN:  64.0 (09/03/2009)   Lymphs:  17.0 (09/03/2009)   Monos:  11.9 (09/03/2009)   Eosinophils:  6.4 (09/03/2009)   Basophil:  0.7 (09/03/2009)  Complete Metabolic Panel   Glucose:  93 (09/03/2009)   Sodium:  144 (09/03/2009)   Potassium:  4.0 (09/03/2009)   Chloride:  103 (09/03/2009)   CO2:  33 (09/03/2009)   BUN:  15 (09/03/2009)   Creatinine:  0.9 (09/03/2009)   Albumin:  4.1 (09/03/2009)   Total Protein:  6.8 (09/03/2009)   Calcium:  9.1 (09/03/2009)   Total Bili:  0.8 (09/03/2009)   Alk Phos:  70 (09/03/2009)   SGPT (ALT):  20 (09/03/2009)   SGOT (AST):  21 (09/03/2009)   Current Problems (verified): 1)  Screening For Lipoid Disorders  (ICD-V77.91) 2)  Other Screening Mammogram  (ICD-V76.12) 3)  Encounter For Long-term Use of Other Medications  (ICD-V58.69) 4)  Fatigue  (ICD-780.79) 5)  Gerd  (ICD-530.81) 6)  Hypothyroidism  (ICD-244.9)  7)  Obesity  (ICD-278.00) 8)  Well Adult Exam  (ICD-V70.0) 9)  Hypertension  (ICD-401.9)  Allergies: 1)  * Penicillin 2)  * Sulfa  Past History:  Past medical, surgical, family and social histories (including risk factors) reviewed, and no changes noted (except as noted below).  Past Medical History: Reviewed history from 09/03/2009 and no changes required. Hypertension Scarlet fever as a child GOITER, NONTOXIC MULTINODULAR (ICD-241.1) Hypothyroidism GERD  Past Surgical History: Reviewed history from 08/13/2008 and no changes required. 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 Family History of Heart Disease: Mother No FH of Colon Cancer: Family History of Diabetes: Grandmother MI: Mother Lung Cancer:  Aunt  Social History: Reviewed history from 08/13/2008 and no changes required. 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 Does Patient Exercise:  no Dental Care w/in 6 mos.:  no HIV Risk:  no risk noted STD Risk:  no risk noted Sexual History:  currently monogamous Drug Use:  never  Review of Systems  General: Denies fever, chills, sweats, anorexia, fatigue, weakness, malaise Eyes: Denies blurring, vision loss ENT: Denies earache, nasal congestion, nosebleeds, sore throat, and hoarseness.  Cardiovascular: Denies chest pains, palpitations, syncope, dyspnea on exertion,  Respiratory: Denies cough, dyspnea at rest, excessive sputum,wheeezing GI: Denies nausea, vomiting, diarrhea, constipation, change in bowel habits, abdominal pain, melena, hematochezia GU: Denies dysuria, hematuria, discharge, urinary frequency, urinary hesitancy, nocturia, incontinence, genital sores, decreased libido Musculoskeletal: Denies back pain, joint pain Derm: Denies rash, itching Neuro: Denies  paresthesias, frequent falls, frequent headaches, and difficulty walking.  Psych: Denies depression, anxiety Endocrine: Denies cold intolerance, heat intolerance, polydipsia, polyphagia, polyuria. WEIGHT GAIN, HAIR THINNING. Heme: Denies enlarged lymph nodes Allergy: No hayfever   Otherwise, the pertinent positives and negatives are listed above and in the HPI, otherwise a full review of systems has been reviewed and is negative unless noted positive.   Physical Exam  General:  Well-developed,well-nourished,in no acute distress; alert,appropriate and cooperative throughout examination Head:  Normocephalic and atraumatic without obvious abnormalities. No apparent alopecia or balding. Eyes:  pupils equal, pupils round, pupils reactive to light,  and pupils react to accomodation.   Ears:  External ear exam shows no significant lesions or deformities.  Otoscopic examination reveals clear canals, tympanic membranes are intact bilaterally without bulging, retraction, inflammation or discharge. Hearing is grossly normal bilaterally. Nose:  External nasal examination shows no deformity or inflammation. Nasal mucosa are pink and moist without lesions or exudates. Mouth:  Oral mucosa and oropharynx without lesions or exudates.  Teeth in good repair. Neck:  thyroidectomy scar Chest Wall:  No deformities, masses, or tenderness noted. Breasts:  No mass, nodules, thickening, tenderness, bulging, retraction, inflamation, nipple discharge or skin changes noted.   Lungs:  Normal respiratory effort, chest expands symmetrically. Lungs are clear to auscultation, no crackles or wheezes. Heart:  Normal rate and regular rhythm. S1 and S2 normal without gallop, murmur, click, rub or other extra sounds. Abdomen:  Bowel sounds positive,abdomen soft and non-tender without masses, organomegaly or hernias noted. Msk:  normal ROM and no crepitation.   Extremities:  No clubbing, cyanosis, edema, or deformity noted with normal full range of motion of all joints.   Neurologic:  alert & oriented X3 and gait normal.  Skin:  Intact without suspicious lesions or rashes Cervical Nodes:  No lymphadenopathy noted Psych:  Cognition and judgment appear intact. Alert and cooperative with normal attention span and concentration. No apparent delusions, illusions, hallucinations   Impression & Recommendations:  Problem # 1:  WELL ADULT EXAM (ICD-V70.0) The patient's preventative maintenance and recommended screening tests for an annual wellness exam were reviewed in full today. Brought up to date unless services declined.  Counselled on the importance of diet, exercise, and its role in overall health and mortality. The patient's FH and SH was reviewed, including their home  life, tobacco status, and drug and alcohol status.   fitness, diet, weight loss very much emphasized to patient. obvious dental caries.  Problem # 2:  OTHER SCREENING MAMMOGRAM (ICD-V76.12)  Orders: Radiology Referral (Radiology)  Complete Medication List: 1)  Furosemide 20 Mg Tabs (Furosemide) .... One by mouth daily 2)  Metoprolol Tartrate 50 Mg Tabs (Metoprolol tartrate) .Marland Kitchen.. 1 two times a day for bp 3)  Synthroid 112 Mcg Tabs (Levothyroxine sodium) .... Take one by mouth daily 4)  Kapidex 60 Mg Cpdr (Dexlansoprazole) .... One tablet by mouth once daily as needed 5)  Diovan Hct 160-12.5 Mg Tabs (Valsartan-hydrochlorothiazide) .... Take 1 each morning for bp 6)  Tylenol 325 Mg Tabs (Acetaminophen) .... Otc as directed. 7)  Vitamin D (ergocalciferol) 50000 Unit Caps (Ergocalciferol) .... Take one tablet once weekly  Other Orders: Venipuncture ([address]) TLB-Cholesterol, Direct LDL (83721-DIRLDL) TLB-BMP (Basic Metabolic Panel-BMET) (80048-METABOL)  Patient Instructions: 1)  Hair Loss: 2)  1 prenatal vitamin a day 3)  1 tablet of biotin 4)  Can try Rogaine (use extra strength, maximum) - apply twice a day 5)  Referral Appointment Information 6)  Day/Date: 7)  Time: 8)  Place/MD: 9)  Address: 10)  Phone/Fax: 11)  Patient given appointment information. Information/Orders faxed/mailed.    Orders Added: 1)  Venipuncture [364[address]] 2)  TLB-Cholesterol, Direct LDL [83721-DIRLDL] 3)  TLB-BMP (Basic Metabolic Panel-BMET) [80048-METABOL] 4)  Radiology Referral [Radiology] 5)  Est. Patient 40-64 years [[address]]    Current Allergies (reviewed today): * PENICILLIN * SULFA  Prevention & Chronic Care Immunizations   Influenza vaccine: Fluvax 3+  (02/25/2009)    Tetanus booster: 11/27/2000: Td    Pneumococcal vaccine: Not documented   Pneumococcal vaccine deferral: Not indicated  (04/21/2010)  Other Screening   Pap smear: NEGATIVE FOR INTRAEPITHELIAL LESIONS OR MALIGNANCY.   (07/18/2008)    Mammogram: ASSESSMENT: Negative - BI-RADS 1^MM DIGITAL SCREENING  (08/29/2008)   Mammogram due: 09/2009   Smoking status: quit  (04/21/2010)  Lipids   Total Cholesterol: 184  (02/25/2009)   LDL: 126  (02/25/2009)   LDL Direct: Not documented   HDL: 43.50  (02/25/2009)   Triglycerides: 73.0  (02/25/2009)  Hypertension   Last Blood Pressure: 100 / 60  (04/21/2010)   Serum creatinine: 0.9  (09/03/2009)   Serum potassium 4.0  (09/03/2009)  Self-Management Support :    Hypertension self-management support: Not documented

## 2010-08-17 NOTE — Op Note (Signed)
Tiffany Porter, PIO NO.:  [id number]   MEDICAL RECORD NO.:  [id number]          PATIENT TYPE:  AMB   LOCATION:  DSC                          FACILITY:  [facility]   PHYSICIAN:  Cindee Salt, M.D.       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   OPERATION:  Proximal row carpectomy, excision posterior interosseous  nerve with the radial styloidectomy, left wrist.   SURGEON:  Cindee Salt, M.D.   ASSISTANT:  Carolyne Fiscal R.N.   ANESTHESIA:  General.   HISTORY:  The patient is a 50 year old female with a history of  Preiser's disease, wrist pain.  This has not responded to conservative  treatment.  MRI is positive.  We have discussed the possibility of  excision of proximal row, scaphoid excision, ulnar four bone fusion,  radial styloidectomy.  She has elected to proceed to have this done.  She is aware of risks and complications including infection, recurrence,  injury to arteries, nerves, tendons, incomplete relief of symptoms,  dystrophy.  She is desirous of proceeding, is aware of risks and  complications.  In the preoperative area questions were encouraged and  answered.  The extremity marked by both the patient and surgeon.   PROCEDURE:  The patient is brought to the operating room where a general  anesthetic was carried out without difficulty.  She was prepped using  DuraPrep, supine position, left arm free.  The limb was exsanguinated  with an Esmarch bandage.  Tourniquet placed high on the arm was inflated  to 250 mmHg.  A straight incision was made over the dorsal aspect of the  wrist centrally located and carried down through subcutaneous tissue.  Bleeders were electrocauterized cauterized.   Neurovascular structures  identified and protected.  Retractors placed.  An incision was then made  over the third, fourth dorsal compartment.  The extensor pollicis longus  tendon was released.  The fourth dorsal compartment was then opened.  Posterior interosseous nerve was resected.  A T incision was then made  in the radiocarpal ligaments dorsally.  This allowed visualization of  the proximal capitate.  Proximal capitate showed no significant  degenerative changes.  As such it was decided to proceed with proximal  row carpectomy.  The proximal row was then isolated the scaphoid was  then excised with blunt and sharp dissection using a rongeur.  Significant necrotic changes were noted within the bone.  This was  removed in toto.  The triquetrum was then removed without difficulty  after incision of the lunotriquetral ligament.  This was isolated,  removed piecemeal with a small rongeur.  A synovectomy was then  performed to the joint.  The lunate was then removed without difficulty  using a rongeur in one single piece.  This protected the volar ligaments  which were  dissected free using a Therapist, nutritional and sharp dissection in  the subperiosteal manner.  The capitate then proximally migrated into  the lunate facet, was quite stable.  Palpation of the radial styloid  which did show arthritic change impacted on the trapezium.  This was  then isolated.  Retractors placed and a removal of the styloid was  performed taking care to protect the radioscaphocapitate ligament.  The  wound was copiously irrigated with saline.  X-rays reveal that the  capitate lay in the lunate fossa.  The capsule was then closed with  figure-of-eight 4-0 Mersilene sutures in a pants over vest manner.  This  stabilized the joint quite well.  Full mobility was obtained.  The  retinaculum was then closed with interrupted 4-0 Vicryl sutures.  Subcutaneous tissue with 4-0 Vicryls and the skin with interrupted 4-0   Vicryl Rapide sutures.  A sterile compressive dressing was applied.  X-  rays revealed that the capitate maintained its position in the lunate  fossa.  Dorsal palmar radial splints were then placed and again x-rays  revealed that the capitate remained in the lunate fossa of the distal  radius.  The patient tolerated the procedure well.  On deflation of  tourniquet all fingers immediately pinked.  She was taken to the  recovery room for observation in satisfactory condition.  The specimen  was sent to pathology.  Specifically for micro of the scaphoid and  general for the remaining bones.  The patient will be admitted for  overnight stay for pain control.  She will be discharged on Percocet.           ______________________________  Cindee Salt, M.D.     GK/MEDQ  D:  11/23/2006  T:  11/24/2006  Job:  [id number]   cc:   Arta Silence, MD

## 2010-08-17 NOTE — Op Note (Signed)
[name], [name] NO.:  [id number]   MEDICAL RECORD NO.:  [id number]          PATIENT TYPE:  AMB   LOCATION:  DAY                          FACILITY:  [facility]   PHYSICIAN:  Anselm Pancoast. Weatherly, M.D.DATE OF BIRTH:  1961/02/14   DATE OF PROCEDURE:  09/19/2006  DATE OF DISCHARGE:                               OPERATIVE REPORT   PREOPERATIVE DIAGNOSIS:  Goiter, multinodular.   POSTOPERATIVE DIAGNOSIS:  Goiter, multinodular, await final pathology  exam.   OPERATION:  Total thyroidectomy.   ANESTHESIA:  General anesthesia.   SURGEON:  Anselm Pancoast. Zachery Dakins, M.D.   ASSISTANT:  Ollen Gross. Vernell Morgans, M.D.   HISTORY:  Tiffany Porter is a 50 year old moderately overweight female who  was referred to me by Dr. Adela Lank.  She has had a known obvious  goiter for ten years.  She was referred to Dr. Juleen China from her regular  physicians, I think a little over a year ago, 2-3 years ago, and he has  placed her on thyroid suppression but the goiter has not decreased in  size.  She has a history of hypertension for which she is on medication.  She weighs about 205 and she is about 5 feet 2 inches in height. She, on  physical exam, had an obvious goiter that appeared symmetrical, though  the right maybe a little larger than the left.  I discussed with her,  after reviewing the ultrasound, I thought that really we ought to go  ahead and proceed with a total thyroidectomy.  She has not had a biopsy  but obviously her gland is large enough that it is symptomatic with  swallowing difficulties and is obviously a goiter.  On the ultrasound  that was done back in April, the thyroid gland has not changed in size.  The right lobe measures 6 x 1.8 by 2, the left is 5.9 x 1.6 x 2, and the  cyst and nodules are more in the right.  There are smaller nodules in  the other portions of the gland.  I discussed with her, since her  thyroid replacement, she is certainly adequately suppressed with  TSH  0.068 and it was not decreasing in size on this regimen, I felt that  surgery was definitely indicated.  She has been replaced on Synthroid 88  mcg tablets per day under Dr. Marylen Ponto direction.  The patient is here  for the planned procedure.  She understands about the calcium and  parathyroids and recurrent laryngeal nerves and is for the planned  procedure.   DESCRIPTION OF PROCEDURE:  She said that as a child, penicillin and  sulfa were given and she had a rash. I think most likely she could take  the Ancef and she was given 1 gram Ancef preoperatively.  She has got  PAS stockings and taken back to the operative suite.  Induction of  general anesthesia and after she was intubated, etc., a roll was placed  up under the shoulders so her neck is fully extended.  Her head was  supported and she appears symmetrical on  the table.  The neck was then  prepped with Betadine solution and draped in a sterile manner.  I marked  about a 2 finger width above the xiphoid and made a little skin  impression with a 2-0 silk that we used for the incision but hopefully  symmetrical.  Sharp dissection down through the skin and subcutaneous  tissue and platysma.  The platysma was elevated so that the Mahorner  retractor could be placed after developing the superior and inferior  flaps.   We then could identify the midline and this was opened and using a ArmyTour manager, we could first inspect the gland symmetrically. The left  appears not quite as large as the right and I elected to remove the left  first to ease the mobility of the right side when it was being freed up.  First, we worked inferiorly, the strap muscles being completely  separated from the gland and then the little pedicle vessels were taken  very close to the gland inferiorly, not going back to the actual major  artery so that we do not compromise the blood supply to the  parathyroids.  With this kind of freed, we then were able  to work on up  to the superior so we could actually identify the superior thyroid  artery on the left. This was encompassed with a right angle and then two  2-0 silk ties with a clip proximal and then the vessel divided between  the ties.  We could then kind of rotate the gland medially.  I was  working from the right side initially, freeing up the left, and we could  identify what we think is definitely superior and inferior parathyroid  glands and the fibrous and vascular attachments were freed so that the  gland could be rolled medially.  We did not really completely free up so  we could identify the recurrent laryngeal nerve since this separated  fairly easily and the gland itself was probably 2x normal size but not  most difficult because of an enormous thyroid gland.  We continued  freeing up, rotating the left lobe from the larynx, and were confident  that we had not damaged the recurrent and had left two parathyroid  glands.   I then switched to the opposite side and with the mid portion of the  gland actually freed from the larynx, the left side was easier since we  had more mobility and a similar dissection working inferiorly first and  then superiorly.  I could definitely see one parathyroid gland which I  think is superior on the right.  The inferior I think we see it but we  did not dissect it out and, again, could peel this total thyroidectomy  fairly easily and numerous little vessels that were actually going to  the gland were either tied with 3-0 silk or baby clips.  The two pieces  of Surgicel were placed over the parathyroids and etc. where most of the  dissection had been and then closed the strap muscles with interrupted  sutures of 4-0 Vicryl.  The platysma was also closed with 4-0 Vicryl  then a running 4-0 Vicryl subcuticular and Benzoin and Steri-Strips on the skin.  A sterile occlusive dressing was applied and the patient was  extubated.  The anesthetist said  she could definitely see both vocal  cords moving symmetrically as the patient was being extubated.  The  patient tolerated the procedure nicely and was sent  to the recovery room  in stable postop condition.   I will check calcium levels this afternoon and in the morning and if her  calcium levels are OK, she should be able to be discharged tomorrow.  She will continue on her antihypertensive medication and the Synthroid  replacements.  Also, I placed a tie around the right lobe for  identification of the right and left for the pathologist, but the  specimen was sent for permanent path exam.  There was no evidence of any  enlarged lymph nodes clinically.           ______________________________  Anselm Pancoast. Zachery Dakins, M.D.     WJW/MEDQ  D:  09/19/2006  T:  09/19/2006  Job:  [id number]

## 2010-09-05 ENCOUNTER — Other Ambulatory Visit: Payer: Self-pay | Admitting: Family Medicine

## 2010-09-05 NOTE — Telephone Encounter (Signed)
Ok to refill all with 6 refills (CPX 04/2010)

## 2011-01-14 LAB — COMPREHENSIVE METABOLIC PANEL
AST: 25
Albumin: 3.2 — ABNORMAL LOW
CO2: 29
Calcium: 7.6 — ABNORMAL LOW
Chloride: 92 — ABNORMAL LOW

## 2011-01-14 LAB — URINALYSIS, ROUTINE W REFLEX MICROSCOPIC
Bilirubin Urine: NEGATIVE
Glucose, UA: NEGATIVE
Hgb urine dipstick: NEGATIVE
Ketones, ur: NEGATIVE
Nitrite: NEGATIVE
Protein, ur: NEGATIVE
Specific Gravity, Urine: 1.006
Urobilinogen, UA: 0.2

## 2011-01-14 LAB — URINE CULTURE

## 2011-01-14 LAB — GASTRIC OCCULT BLOOD (1-CARD TO LAB)

## 2011-01-14 LAB — BASIC METABOLIC PANEL
BUN: 10
CO2: 26
CO2: 31
Chloride: 100
Creatinine, Ser: 0.87
Creatinine, Ser: 0.98
GFR calc Af Amer: >60
GFR calc non Af Amer: >60
Glucose, Bld: 98
Potassium: 3.4 — ABNORMAL LOW
Potassium: 3.9
Sodium: 134 — ABNORMAL LOW
Sodium: 136

## 2011-01-14 LAB — MAGNESIUM: Magnesium: 1.7

## 2011-01-14 LAB — POCT HEMOGLOBIN-HEMACUE: Hemoglobin: 15.1 — ABNORMAL HIGH

## 2011-01-14 LAB — LIPASE, BLOOD: Lipase: 19

## 2011-01-14 LAB — CBC
RDW: 13.4
WBC: 9.7

## 2011-01-19 LAB — CALCIUM: Calcium: 7.1 — ABNORMAL LOW

## 2011-01-20 LAB — COMPREHENSIVE METABOLIC PANEL
AST: 20
BUN: 8
CO2: 30
Chloride: 104
Creatinine, Ser: 0.9
GFR calc Af Amer: >60
GFR calc non Af Amer: >60
Total Bilirubin: 0.8

## 2011-01-20 LAB — DIFFERENTIAL
Basophils Absolute: 0
Basophils Relative: 1
Eosinophils Relative: 1
Lymphocytes Relative: 20

## 2011-01-20 LAB — CBC
HCT: 41.7
MCV: 93.9
RBC: 4.45
WBC: 7.8

## 2011-04-08 ENCOUNTER — Other Ambulatory Visit: Payer: Self-pay | Admitting: Family Medicine

## 2011-05-10 ENCOUNTER — Other Ambulatory Visit: Payer: Self-pay | Admitting: Family Medicine

## 2011-08-12 ENCOUNTER — Other Ambulatory Visit: Payer: Self-pay | Admitting: Family Medicine

## 2011-09-09 ENCOUNTER — Encounter: Payer: Self-pay | Admitting: Internal Medicine

## 2011-09-09 ENCOUNTER — Ambulatory Visit (INDEPENDENT_AMBULATORY_CARE_PROVIDER_SITE_OTHER): Payer: PRIVATE HEALTH INSURANCE | Admitting: Internal Medicine

## 2011-09-09 VITALS — BP 120/70 | HR 76 | Temp 98.5°F | Wt 212.0 lb

## 2011-09-09 DIAGNOSIS — J209 Acute bronchitis, unspecified: Secondary | ICD-10-CM

## 2011-09-09 MED ORDER — HYDROCODONE-HOMATROPINE 5-1.5 MG/5ML PO SYRP: 5.0000 mL | ORAL_SOLUTION | Freq: Every evening | ORAL | Status: AC | PRN

## 2011-09-09 NOTE — Assessment & Plan Note (Signed)
Discussed that this is most likely viral Will give cough syrup for nighttime If worse next week, will give empiric antibiotic

## 2011-09-09 NOTE — Progress Notes (Signed)
  Subjective:    Patient ID: Tiffany Porter, female    DOB: 01/19/1961, 51 y.o.   MRN: [id number]  HPI Started with tickle in throat about 5 days ago Coughing at night Now worse---keeping her up at night Feels that something is stuck in back of throat Some headache Husband with similar illness--diagnosed with bronchitis  Cough mostly dry No fever Slight chills at times---like at work ?slight SOB Only slight PND  [employer] and chloraseptic spray Tylenol also Current Outpatient Prescriptions on File Prior to Visit  Medication Sig Dispense Refill  . furosemide (LASIX) 20 MG tablet TAKE 1 TABLET BY MOUTH EVERY DAY  30 tablet  0  . levothyroxine (SYNTHROID, LEVOTHROID) 112 MCG tablet Take 112 mcg by mouth daily.      . metoprolol (LOPRESSOR) 50 MG tablet TAKE 1 TABLET BY MOUTH TWICE A DAY FOR BLOOD PRESSURE  60 tablet  6  . valsartan-hydrochlorothiazide (DIOVAN-HCT) 160-12.5 MG per tablet TAKE 1 TABLET BY MOUTH EVERY DAY IN THE MORNING FOR BLOOD PRESSURE  30 tablet  0    Allergies  Allergen Reactions  . Penicillins     REACTION: unspecified  . Sulfonamide Derivatives     REACTION: unspecified    Past Medical History  Diagnosis Date  . Hypertension   . GERD (gastroesophageal reflux disease)   . Thyroid disease   . Nontoxic multinodular goiter   . Scarlet fever     as a child    Past Surgical History  Procedure Date  . Total thyroidectomy 06/08  . Wrist surgery 08/08    left wrist surgery for preisers disease    No family history on file.  History   Social History  . Marital Status: Married    Spouse Name: N/A    Number of Children: N/A  . Years of Education: N/A   Occupational History  . Not on file.   Social History Main Topics  . Smoking status: Former Smoker    Types: Cigarettes  . Smokeless tobacco: Never Used  . Alcohol Use: Yes  . Drug Use: No  . Sexually Active: Not on file   Other Topics Concern  . Not on file   Social History Narrative  .  No narrative on file   Review of Systems Had night sweats regularly--no change No rash No nausea or vomiting No history of asthma    Objective:   Physical Exam  Constitutional: She appears well-developed and well-nourished. No distress.  HENT:  Mouth/Throat: No oropharyngeal exudate.       ?slight maxillary tenderness Mild nasal congestion Slight pharyngeal injection TMs normal  Neck: Normal range of motion. Neck supple.  Pulmonary/Chest: Effort normal and breath sounds normal. No respiratory distress. She has no rales.       Coarse cough Very faint exp wheeze scattered---but normal exp phase  Lymphadenopathy:    She has no cervical adenopathy.          Assessment & Plan:

## 2011-09-14 ENCOUNTER — Other Ambulatory Visit: Payer: Self-pay | Admitting: Family Medicine

## 2011-09-15 ENCOUNTER — Ambulatory Visit: Payer: Self-pay | Admitting: Family Medicine

## 2011-09-21 ENCOUNTER — Ambulatory Visit: Payer: Self-pay | Admitting: Family Medicine

## 2011-09-29 ENCOUNTER — Encounter: Payer: Self-pay | Admitting: *Deleted

## 2011-09-29 ENCOUNTER — Ambulatory Visit (INDEPENDENT_AMBULATORY_CARE_PROVIDER_SITE_OTHER): Payer: Managed Care, Other (non HMO) | Admitting: Family Medicine

## 2011-09-29 ENCOUNTER — Encounter: Payer: Self-pay | Admitting: Family Medicine

## 2011-09-29 VITALS — BP 120/72 | HR 57 | Temp 98.5°F | Ht 61.0 in | Wt 213.0 lb

## 2011-09-29 DIAGNOSIS — I10 Essential (primary) hypertension: Secondary | ICD-10-CM

## 2011-09-29 DIAGNOSIS — M549 Dorsalgia, unspecified: Secondary | ICD-10-CM

## 2011-09-29 DIAGNOSIS — Z13220 Encounter for screening for lipoid disorders: Secondary | ICD-10-CM

## 2011-09-29 DIAGNOSIS — E039 Hypothyroidism, unspecified: Secondary | ICD-10-CM

## 2011-09-29 DIAGNOSIS — R5383 Other fatigue: Secondary | ICD-10-CM

## 2011-09-29 DIAGNOSIS — R5381 Other malaise: Secondary | ICD-10-CM

## 2011-09-29 LAB — LIPID PANEL
HDL: 47.8 mg/dL (ref >39.00)
LDL Cholesterol: 113 mg/dL — ABNORMAL HIGH (ref 0–99)
VLDL: 13.4 mg/dL (ref 0.0–40.0)

## 2011-09-29 LAB — VITAMIN B12: Vitamin B-12: 443 pg/mL (ref 211–911)

## 2011-09-29 LAB — CBC WITH DIFFERENTIAL/PLATELET
Basophils Absolute: 0 10*3/uL (ref 0.0–0.1)
Basophils Relative: 0.6 % (ref 0.0–3.0)
Eosinophils Absolute: 0.2 10*3/uL (ref 0.0–0.7)
Lymphocytes Relative: 13.7 % (ref 12.0–46.0)
MCHC: 34 g/dL (ref 30.0–36.0)
Neutrophils Relative %: 69.6 % (ref 43.0–77.0)
Platelets: 332 10*3/uL (ref 150.0–400.0)
RBC: 4.38 Mil/uL (ref 3.87–5.11)
RDW: 12.9 % (ref 11.5–14.6)

## 2011-09-29 LAB — HEPATIC FUNCTION PANEL
AST: 21 U/L (ref 0–37)
Alkaline Phosphatase: 67 U/L (ref 39–117)
Bilirubin, Direct: 0 mg/dL (ref 0.0–0.3)
Total Bilirubin: 0.8 mg/dL (ref 0.3–1.2)

## 2011-09-29 LAB — BASIC METABOLIC PANEL
Chloride: 99 mEq/L (ref 96–112)
GFR: 66.89 mL/min (ref >60.00)
Potassium: 3.8 mEq/L (ref 3.5–5.1)
Sodium: 138 mEq/L (ref 135–145)

## 2011-09-29 NOTE — Progress Notes (Signed)
Nature conservation officer at [facility] [address] [place] [address] Phone: [phone] Fax: [phone]   Patient Name: Tiffany Porter Date of Birth: 1960-06-12 Age: 51 y.o. Medical Record Number: [id number] Gender: female Date of Encounter: 09/29/2011  History of Present Illness:  Tiffany Porter is a 51 y.o. very pleasant female patient who presents with the following:  HTN: Tolerating all medications without side effects Stable and at goal No CP, no sob. No HA.  BP Readings from Last 3 Encounters:  09/29/11 120/72  09/09/11 120/70  04/21/10 100/60    Basic Metabolic Panel:    Component Value Date/Time   NA 138 09/29/2011 0844   K 3.8 09/29/2011 0844   CL 99 09/29/2011 0844   CO2 30 09/29/2011 0844   BUN 15 09/29/2011 0844   CREATININE 0.9 09/29/2011 0844   GLUCOSE 88 09/29/2011 0844   CALCIUM 9.2 09/29/2011 0844   Back has been hurting bad in the lumbar region. Has to stand up about 90% of the time.  Working 50-55 hours a week She is on her feet essentially all the time. pain are in primarily in the lumbar spine region, as well as in her SI joints. She hasn't done any particular trauma or injury. No radiculopathy. No numbness or tingling. No bowel or bladder incontinence. Localizes primarily to the back. She'll S. Sometimes the pain in her upper buttocks region as well.  She does not do any regular exercise. Her BMI is over 40.  Thyroid, seeing Dr. Juleen China. Has been stable  Past Medical History, Surgical History, Social History, Family History, Problem List, Medications, and Allergies have been reviewed and updated if relevant.  Prior to Admission medications   Medication Sig Start Date End Date Taking? Authorizing Provider  celecoxib (CELEBREX) 100 MG capsule PATIENT UNSURE OF DOSE   Yes Historical Provider, MD  furosemide (LASIX) 20 MG tablet TAKE 1 TABLET BY MOUTH EVERY DAY -- NEED OFFICE VISIT FOR FURTHER REFILLS 09/14/11  Yes Ryot Burrous, MD  levothyroxine  (SYNTHROID, LEVOTHROID) 112 MCG tablet Take 112 mcg by mouth daily.   Yes Historical Provider, MD  metoprolol (LOPRESSOR) 50 MG tablet TAKE 1 TABLET BY MOUTH TWICE A DAY FOR BLOOD PRESSURE 05/10/11  Yes Jayline Kilburg, MD  valsartan-hydrochlorothiazide (DIOVAN-HCT) 160-12.5 MG per tablet TAKE 1 TABLET BY MOUTH EVERY DAY IN THE MORNING FOR BLOOD PRESSURE 09/14/11  Yes Hannah Beat, MD  Vitamin D, Ergocalciferol, (DRISDOL) 50000 UNITS CAPS Take 50,000 Units by mouth.   Yes Historical Provider, MD    Review of Systems:  GEN: No acute illnesses, no fevers, chills. GI: No n/v/d, eating normally Pulm: No SOB Interactive and getting along well at home.  Otherwise, ROS is as per the HPI.   Physical Examination: Filed Vitals:   09/29/11 0752  BP: 120/72  Pulse: 57  Temp: 98.5 F (36.9 C)   Filed Vitals:   09/29/11 0752  Height: 5\' 1"  (1.549 m)  Weight: 213 lb (96.616 kg)   Body mass index is 40.25 kg/(m^2). Ideal Body Weight: Weight in (lb) to have BMI = 25: 132    GEN: Well-developed,well-nourished,in no acute distress; alert,appropriate and cooperative throughout examination HEENT: Normocephalic and atraumatic without obvious abnormalities. Ears, externally no deformities PULM: Breathing comfortably in no respiratory distress, CTAB CV: RRR, no murmur, gallup or rub EXT: No clubbing, cyanosis, or edema PSYCH: Normally interactive. Cooperative during the interview. Pleasant. Friendly and conversant. Not anxious or depressed appearing. Normal, full affect.  Range of  motion at  the waist: Flexion, extension, lateral bending and rotation: full, mild pain with ext  No echymosis or edema Rises to examination table with mild difficulty Gait: minimally antalgic  Inspection/Deformity: N Paraspinus Tenderness: l3-s1  B Ankle Dorsiflexion (L5,4): 5/5 B Great Toe Dorsiflexion (L5,4): 5/5 Heel Walk (L5): WNL Toe Walk (S1): WNL Rise/Squat (L4): WNL  SENSORY B Medial Foot (L4):  WNL B Dorsum (L5): WNL B Lateral (S1): WNL Light Touch: WNL  REFLEXES Knee (L4): 2+ Ankle (S1): 2+  B SLR, seated: neg B SLR, supine: neg B FABER: neg B Reverse FABER: mild pain B Greater Troch: NT B Log Roll: neg B Stork: NT B Sciatic Notch: ttp   Assessment and Plan: 1. HYPERTENSION    2. HYPOTHYROIDISM    3. Screening for lipoid disorders  Lipid panel  4. Other malaise and fatigue  CBC with Differential, Basic metabolic panel, Vitamin B12, Hepatic function panel  5. Back pain      Hypertension is stable. Thyroid followed by endocrinology.  Patient does feel tired much of the time, we'll check some basic laboratories to ensure that there are no physiological correctable, problems  Multifactorial musculoskeletal back pain.  Very lordotic stance. Abdicated core stability. Advocated increased fitness. Reviewed [facility] program with the patient. Continue with Celebrex or other NSAIDs as needed.  Hannah Beat, MD

## 2011-09-30 ENCOUNTER — Other Ambulatory Visit: Payer: Self-pay | Admitting: Family Medicine

## 2011-10-04 ENCOUNTER — Other Ambulatory Visit: Payer: Self-pay | Admitting: Family Medicine

## 2012-01-31 ENCOUNTER — Other Ambulatory Visit: Payer: Self-pay | Admitting: Family Medicine

## 2012-02-08 ENCOUNTER — Other Ambulatory Visit: Payer: Self-pay | Admitting: Orthopedic Surgery

## 2012-02-09 ENCOUNTER — Encounter (HOSPITAL_BASED_OUTPATIENT_CLINIC_OR_DEPARTMENT_OTHER): Payer: Self-pay | Admitting: *Deleted

## 2012-02-09 ENCOUNTER — Encounter (HOSPITAL_BASED_OUTPATIENT_CLINIC_OR_DEPARTMENT_OTHER)
Admission: RE | Admit: 2012-02-09 | Discharge: 2012-02-09 | Disposition: A | Discharge status: Home / Self Care | Payer: 59 | Source: Ambulatory Visit | Attending: Orthopedic Surgery | Admitting: Orthopedic Surgery

## 2012-02-09 LAB — BASIC METABOLIC PANEL
BUN: 13 mg/dL (ref 6–23)
CO2: 30 mEq/L (ref 19–32)
Chloride: 102 mEq/L (ref 96–112)
GFR calc Af Amer: 87 mL/min — ABNORMAL LOW (ref >90)
Potassium: 3.4 mEq/L — ABNORMAL LOW (ref 3.5–5.1)

## 2012-02-09 NOTE — Progress Notes (Signed)
To come in for [id number]-ekg No cardiac problems sleep apnea or resp problems

## 2012-02-14 ENCOUNTER — Encounter (HOSPITAL_BASED_OUTPATIENT_CLINIC_OR_DEPARTMENT_OTHER): Payer: Self-pay | Admitting: *Deleted

## 2012-02-14 ENCOUNTER — Encounter (HOSPITAL_BASED_OUTPATIENT_CLINIC_OR_DEPARTMENT_OTHER): Payer: Self-pay | Admitting: Certified Registered Nurse Anesthetist

## 2012-02-14 ENCOUNTER — Encounter (HOSPITAL_BASED_OUTPATIENT_CLINIC_OR_DEPARTMENT_OTHER)
Admission: RE | Disposition: A | Discharge status: Home / Self Care | Payer: Self-pay | Source: Ambulatory Visit | Attending: Orthopedic Surgery

## 2012-02-14 ENCOUNTER — Ambulatory Visit (HOSPITAL_BASED_OUTPATIENT_CLINIC_OR_DEPARTMENT_OTHER): Payer: 59 | Admitting: Certified Registered Nurse Anesthetist

## 2012-02-14 ENCOUNTER — Encounter (HOSPITAL_BASED_OUTPATIENT_CLINIC_OR_DEPARTMENT_OTHER): Payer: Self-pay | Admitting: Orthopedic Surgery

## 2012-02-14 ENCOUNTER — Ambulatory Visit (HOSPITAL_BASED_OUTPATIENT_CLINIC_OR_DEPARTMENT_OTHER)
Admission: RE | Admit: 2012-02-14 | Discharge: 2012-02-14 | Disposition: A | Discharge status: Home / Self Care | Payer: 59 | Source: Ambulatory Visit | Attending: Orthopedic Surgery | Admitting: Orthopedic Surgery

## 2012-02-14 DIAGNOSIS — E039 Hypothyroidism, unspecified: Secondary | ICD-10-CM | POA: Insufficient documentation

## 2012-02-14 DIAGNOSIS — Z01812 Encounter for preprocedural laboratory examination: Secondary | ICD-10-CM | POA: Insufficient documentation

## 2012-02-14 DIAGNOSIS — G5600 Carpal tunnel syndrome, unspecified upper limb: Secondary | ICD-10-CM | POA: Insufficient documentation

## 2012-02-14 DIAGNOSIS — Z79899 Other long term (current) drug therapy: Secondary | ICD-10-CM | POA: Insufficient documentation

## 2012-02-14 DIAGNOSIS — I10 Essential (primary) hypertension: Secondary | ICD-10-CM | POA: Insufficient documentation

## 2012-02-14 DIAGNOSIS — K219 Gastro-esophageal reflux disease without esophagitis: Secondary | ICD-10-CM | POA: Insufficient documentation

## 2012-02-14 DIAGNOSIS — Z01810 Encounter for preprocedural cardiovascular examination: Secondary | ICD-10-CM | POA: Insufficient documentation

## 2012-02-14 HISTORY — PX: CARPAL TUNNEL RELEASE: SHX101

## 2012-02-14 HISTORY — DX: Encounter for fitting and adjustment of spectacles and contact lenses: Z46.0

## 2012-02-14 SURGERY — CARPAL TUNNEL RELEASE
Anesthesia: General | Site: Wrist | Laterality: Right | Wound class: Clean

## 2012-02-14 MED ORDER — EPHEDRINE SULFATE 50 MG/ML IJ SOLN
INTRAMUSCULAR | Status: DC | PRN
  Administered 2012-02-14 (×2): 10 mg via INTRAVENOUS

## 2012-02-14 MED ORDER — LIDOCAINE HCL (CARDIAC) 20 MG/ML IV SOLN
INTRAVENOUS | Status: DC | PRN
  Administered 2012-02-14: 60 mg via INTRAVENOUS

## 2012-02-14 MED ORDER — CHLORHEXIDINE GLUCONATE 4 % EX LIQD: 60.0000 mL | Freq: Once | CUTANEOUS | Status: DC

## 2012-02-14 MED ORDER — BUPIVACAINE HCL (PF) 0.25 % IJ SOLN
INTRAMUSCULAR | Status: DC | PRN
  Administered 2012-02-14: 6 mL

## 2012-02-14 MED ORDER — VANCOMYCIN HCL IN DEXTROSE 1-5 GM/200ML-% IV SOLN
1000.0000 mg | INTRAVENOUS | Status: AC
  Administered 2012-02-14: 1000 mg via INTRAVENOUS

## 2012-02-14 MED ORDER — FENTANYL CITRATE 0.05 MG/ML IJ SOLN
INTRAMUSCULAR | Status: DC | PRN
  Administered 2012-02-14: 50 ug via INTRAVENOUS

## 2012-02-14 MED ORDER — ONDANSETRON HCL 4 MG/2ML IJ SOLN
INTRAMUSCULAR | Status: DC | PRN
  Administered 2012-02-14: 4 mg via INTRAVENOUS

## 2012-02-14 MED ORDER — PROPOFOL 10 MG/ML IV BOLUS
INTRAVENOUS | Status: DC | PRN
  Administered 2012-02-14: 150 mg via INTRAVENOUS

## 2012-02-14 MED ORDER — 0.9 % SODIUM CHLORIDE (POUR BTL) OPTIME
TOPICAL | Status: DC | PRN
  Administered 2012-02-14: 100 mL

## 2012-02-14 MED ORDER — MIDAZOLAM HCL 5 MG/5ML IJ SOLN
INTRAMUSCULAR | Status: DC | PRN
  Administered 2012-02-14: 1 mg via INTRAVENOUS

## 2012-02-14 MED ORDER — LACTATED RINGERS IV SOLN
INTRAVENOUS | Status: DC
  Administered 2012-02-14: 08:00:00 via INTRAVENOUS

## 2012-02-14 MED ORDER — DEXAMETHASONE SODIUM PHOSPHATE 4 MG/ML IJ SOLN
INTRAMUSCULAR | Status: DC | PRN
  Administered 2012-02-14: 10 mg via INTRAVENOUS

## 2012-02-14 SURGICAL SUPPLY — 41 items
BANDAGE GAUZE ELAST BULKY 4 IN (GAUZE/BANDAGES/DRESSINGS) ×2 IMPLANT
BLADE SURG 15 STRL LF DISP TIS (BLADE) ×1 IMPLANT
BLADE SURG 15 STRL SS (BLADE) ×2
BNDG CMPR 9X4 STRL LF SNTH (GAUZE/BANDAGES/DRESSINGS) ×1
BNDG COHESIVE 3X5 TAN STRL LF (GAUZE/BANDAGES/DRESSINGS) ×2 IMPLANT
BNDG ESMARK 4X9 LF (GAUZE/BANDAGES/DRESSINGS) ×1 IMPLANT
CHLORAPREP W/TINT 26ML (MISCELLANEOUS) ×2 IMPLANT
CLOTH BEACON ORANGE TIMEOUT ST (SAFETY) ×2 IMPLANT
CORDS BIPOLAR (ELECTRODE) ×2 IMPLANT
COVER MAYO STAND STRL (DRAPES) ×2 IMPLANT
COVER TABLE BACK 60X90 (DRAPES) ×2 IMPLANT
CUFF TOURNIQUET SINGLE 18IN (TOURNIQUET CUFF) ×2 IMPLANT
DRAPE EXTREMITY T 121X128X90 (DRAPE) ×2 IMPLANT
DRAPE SURG 17X23 STRL (DRAPES) ×2 IMPLANT
DRSG KUZMA FLUFF (GAUZE/BANDAGES/DRESSINGS) ×2 IMPLANT
GAUZE XEROFORM 1X8 LF (GAUZE/BANDAGES/DRESSINGS) ×2 IMPLANT
GLOVE BIO SURGEON STRL SZ 6.5 (GLOVE) ×2 IMPLANT
GLOVE BIO SURGEON STRL SZ7.5 (GLOVE) ×1 IMPLANT
GLOVE BIOGEL PI IND STRL 8 (GLOVE) IMPLANT
GLOVE BIOGEL PI IND STRL 8.5 (GLOVE) ×1 IMPLANT
GLOVE BIOGEL PI INDICATOR 8 (GLOVE) ×1
GLOVE BIOGEL PI INDICATOR 8.5 (GLOVE) ×1
GLOVE EXAM NITRILE EXT CUFF MD (GLOVE) ×1 IMPLANT
GLOVE SURG ORTHO 8.0 STRL STRW (GLOVE) ×2 IMPLANT
GOWN BRE IMP PREV XXLGXLNG (GOWN DISPOSABLE) ×3 IMPLANT
GOWN PREVENTION PLUS XLARGE (GOWN DISPOSABLE) ×2 IMPLANT
NEEDLE 27GAX1X1/2 (NEEDLE) ×1 IMPLANT
NS IRRIG 1000ML POUR BTL (IV SOLUTION) ×2 IMPLANT
PACK BASIN DAY SURGERY FS (CUSTOM PROCEDURE TRAY) ×2 IMPLANT
PAD CAST 3X4 CTTN HI CHSV (CAST SUPPLIES) ×1 IMPLANT
PADDING CAST ABS 4INX4YD NS (CAST SUPPLIES) ×1
PADDING CAST ABS COTTON 4X4 ST (CAST SUPPLIES) ×1 IMPLANT
PADDING CAST COTTON 3X4 STRL (CAST SUPPLIES) ×2
SPONGE GAUZE 4X4 12PLY (GAUZE/BANDAGES/DRESSINGS) ×2 IMPLANT
STOCKINETTE 4X48 STRL (DRAPES) ×2 IMPLANT
SUT VICRYL 4-0 PS2 18IN ABS (SUTURE) IMPLANT
SUT VICRYL RAPIDE 4/0 PS 2 (SUTURE) ×2 IMPLANT
SYR BULB 3OZ (MISCELLANEOUS) ×2 IMPLANT
SYR CONTROL 10ML LL (SYRINGE) ×1 IMPLANT
TOWEL OR 17X24 6PK STRL BLUE (TOWEL DISPOSABLE) ×2 IMPLANT
UNDERPAD 30X30 INCONTINENT (UNDERPADS AND DIAPERS) ×2 IMPLANT

## 2012-02-14 NOTE — Anesthesia Procedure Notes (Signed)
Procedure Name: LMA Insertion Date/Time: 02/14/2012 8:54 AM Performed by: Uma Jerde D Pre-anesthesia Checklist: Patient identified, Emergency Drugs available, Suction available and Patient being monitored Patient Re-evaluated:Patient Re-evaluated prior to inductionOxygen Delivery Method: Circle System Utilized Preoxygenation: Pre-oxygenation with 100% oxygen Intubation Type: IV induction Ventilation: Mask ventilation without difficulty LMA: LMA inserted LMA Size: 4.0 Number of attempts: 1 Airway Equipment and Method: bite block Placement Confirmation: positive ETCO2 Tube secured with: Tape Dental Injury: Teeth and Oropharynx as per pre-operative assessment

## 2012-02-14 NOTE — Op Note (Signed)
dictated

## 2012-02-14 NOTE — Brief Op Note (Signed)
02/14/2012  2:43 PM  PATIENT:  Tiffany Porter  51 y.o. female  PRE-OPERATIVE DIAGNOSIS:  CTS RIGHT  POST-OPERATIVE DIAGNOSIS:  Right Carpal tunnel Syndrome  PROCEDURE:  Procedure(s) (LRB) with comments: CARPAL TUNNEL RELEASE (Right)  SURGEON:  Surgeon(s) and Role:    * Nicki Reaper, MD - Primary    * Tami Ribas, MD - Assisting  PHYSICIAN ASSISTANT:   ASSISTANTS: none   ANESTHESIA:   local and regional  EBL:  Total I/O In: 800 [I.V.:800] Out: -   BLOOD ADMINISTERED:none  DRAINS: none   LOCAL MEDICATIONS USED:  MARCAINE     SPECIMEN:  No Specimen  DISPOSITION OF SPECIMEN:  N/A  COUNTS:  YES  TOURNIQUET:  * Missing tourniquet times found for documented tourniquets in log:  [id number] *  DICTATION: .Other Dictation: Dictation Number done 02-14-12  PLAN OF CARE: Discharge to home after PACU  PATIENT DISPOSITION:  PACU - hemodynamically stable. l

## 2012-02-14 NOTE — Anesthesia Preprocedure Evaluation (Addendum)
Anesthesia Evaluation  Patient identified by MRN, date of birth, ID band Patient awake    Reviewed: Allergy & Precautions, H&P , NPO status , Patient's Chart, lab work & pertinent test results, reviewed documented beta blocker date and time   Airway Mallampati: II TM Distance: >3 FB Neck ROM: Full    Dental No notable dental hx. (+) Teeth Intact, Chipped and Dental Advisory Given   Pulmonary neg pulmonary ROS,  breath sounds clear to auscultation  Pulmonary exam normal       Cardiovascular hypertension, On Home Beta Blockers and On Medications Rhythm:Regular Rate:Normal     Neuro/Psych negative neurological ROS  negative psych ROS   GI/Hepatic Neg liver ROS, GERD-  Medicated and Controlled,  Endo/Other  Hypothyroidism   Renal/GU negative Renal ROS  negative genitourinary   Musculoskeletal   Abdominal   Peds  Hematology negative hematology ROS (+)   Anesthesia Other Findings   Reproductive/Obstetrics negative OB ROS                          Anesthesia Physical Anesthesia Plan  ASA: II  Anesthesia Plan: General   Post-op Pain Management:    Induction: Intravenous  Airway Management Planned: LMA  Additional Equipment:   Intra-op Plan:   Post-operative Plan: Extubation in OR  Informed Consent: I have reviewed the patients History and Physical, chart, labs and discussed the procedure including the risks, benefits and alternatives for the proposed anesthesia with the patient or authorized representative who has indicated his/her understanding and acceptance.   Dental advisory given  Plan Discussed with: CRNA and Surgeon  Anesthesia Plan Comments:        Anesthesia Quick Evaluation

## 2012-02-14 NOTE — Brief Op Note (Signed)
02/14/2012  2:41 PM  PATIENT:  Tiffany Porter  51 y.o. female  PRE-OPERATIVE DIAGNOSIS:  CTS RIGHT  POST-OPERATIVE DIAGNOSIS:  Right Carpal tunnel Syndrome  PROCEDURE:  Procedure(s) (LRB) with comments: CARPAL TUNNEL RELEASE (Right)  SURGEON:  Surgeon(s) and Role:    * Nicki Reaper, MD - Primary    * Tami Ribas, MD - Assisting  PHYSICIAN ASSISTANT:   ASSISTANTS: K Dudley Mages,MD   ANESTHESIA:   local and general  EBL:  Total I/O In: 800 [I.V.:800] Out: -   BLOOD ADMINISTERED:none  DRAINS: none   LOCAL MEDICATIONS USED:  MARCAINE     SPECIMEN:  No Specimen  DISPOSITION OF SPECIMEN:  N/A  COUNTS:  YES  TOURNIQUET:  * Missing tourniquet times found for documented tourniquets in log:  [id number] *  DICTATION: .Other Dictation: Dictation Number ? dictated [id number]  PLAN OF CARE: Discharge to home after PACU  PATIENT DISPOSITION:  PACU - hemodynamically stable.

## 2012-02-14 NOTE — H&P (Signed)
Tiffany Porter is a 51 year-old right-hand dominant former patient who comes in complaining of pain, numbness and tingling in her index through ring finger right hand.  This has been gradually increasing over the past several years.  She recalls no specific history of injury to the hand or to the neck. She does complain of some pain. She states putting on her makeup, doing her hair increases this.  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  On her right side she complains of an intermittent, moderate to severe aching, throbbing pain with a feeling of numbness.  It awakens her at night requiring her to shake her hand. She has history of thyroid problems, no history of diabetes, arthritis or gout.  . She has had her nerve conductions done by Dr. Johna Roles revealing carpal tunnel syndrome on her right side with motor delay of 4.7, sensory delay is greater than normal  ALLERGIES:    Sulfa and penicillin.  MEDICATIONS:    Furosemide, metoprolol, valsartan-HCTZ, Synthroid, Celebrex, tramadol and biotin.  SURGICAL HISTORY:    Thyroid surgery, hand surgery, both in 2008.  FAMILY MEDICAL HISTORY:    Positive for diabetes, heart disease, high blood pressure and arthritis.   SOCIAL HISTORY:     She does not smoke drinks socially.  She is married and works as a Production designer, theatre/television/film at [employer].  REVIEW OF SYSTEMS:   Positive for contacts, high blood pressure, pneumonia, otherwise negative 14 points.  Tiffany Porter is an 51 y.o. female.   Chief Complaint: ctsrt HPI: see above  Past Medical History  Diagnosis Date  . Hypertension   . GERD (gastroesophageal reflux disease)   . Thyroid disease   . Nontoxic multinodular goiter   . Scarlet fever     as a child  . Contact lens/glasses fitting     wears contacts  or glasses    Past Surgical History  Procedure Date  . Total thyroidectomy 06/08  . Wrist surgery 08/08    left wrist surgery for preisers  disease    History reviewed. No pertinent family history. Social History:  reports that she quit smoking about 9 years ago. Her smoking use included Cigarettes. She has never used smokeless tobacco. She reports that she drinks alcohol. She reports that she does not use illicit drugs.  Allergies:  Allergies  Allergen Reactions  . Penicillins     REACTION: unspecified  . Sulfonamide Derivatives     REACTION: unspecified    Medications Prior to Admission  Medication Sig Dispense Refill  . celecoxib (CELEBREX) 100 MG capsule PATIENT UNSURE OF DOSE      . furosemide (LASIX) 20 MG tablet TAKE 1 TABLET BY MOUTH EVERY DAY -- NEED OFFICE VISIT FOR FURTHER REFILLS  30 tablet  6  . levothyroxine (SYNTHROID, LEVOTHROID) 112 MCG tablet Take 112 mcg by mouth daily.      . metoprolol (LOPRESSOR) 50 MG tablet TAKE 1 TABLET BY MOUTH TWICE A DAY FOR BLOOD PRESSURE  60 tablet  1  . traMADol (ULTRAM) 50 MG tablet Take 50 mg by mouth every 6 (six) hours as needed.      . valsartan-hydrochlorothiazide (DIOVAN-HCT) 160-12.5 MG per tablet TAKE 1 TABLET BY MOUTH EVERY DAY IN THE MORNING FOR BLOOD PRESSURE  30 tablet  6  . Vitamin D, Ergocalciferol, (DRISDOL) 50000 UNITS CAPS Take 50,000 Units by mouth.        No results found for this or  any previous visit (from the past 48 hour(s)).  No results found.   Pertinent items are noted in HPI.  Blood pressure 104/67, pulse 53, temperature 97.7 F (36.5 C), temperature source Oral, resp. rate 18, height 5\' 1"  (1.549 m), weight 92.987 kg (205 lb), SpO2 96.00%.  General appearance: alert, cooperative and appears stated age Head: Normocephalic, without obvious abnormality Neck: no adenopathy Resp: clear to auscultation bilaterally Cardio: regular rate and rhythm, S1, S2 normal, no murmur, click, rub or gallop GI: soft, non-tender; bowel sounds normal; no masses,  no organomegaly Extremities: extremities normal, atraumatic, no cyanosis or edema Pulses: 2+ and  symmetric Skin: Skin color, texture, turgor normal. No rashes or lesions Neurologic: Grossly normal Incision/Wound: na  Assessment/Plan . We have discussed the possibility of a Medrol Dosepak, injection or surgical intervention. The pre, peri and post op course are discussed along with risks and complications.  There is no guarantee with surgery, possibility of infection, recurrence, injury to arteries, nerves and tendons, incomplete relief of symptoms and dystrophy.  In that she would like to forego any further conservative treatment as she states she has had injections for a foot problem that did not resolve this for her, she would prefer to just have this surgically released. She is scheduled for right carpal tunnel release as an outpatient.  Darian Cansler R 02/14/2012, 8:40 AM

## 2012-02-15 NOTE — Op Note (Signed)
[name], Tiffany Porter NO.:  [id number]  MEDICAL RECORD NO.:  [id number]  LOCATION:                                 FACILITY:  PHYSICIAN:  Cindee Salt, M.D.            DATE OF BIRTH:  DATE OF PROCEDURE:  02/14/2012 DATE OF DISCHARGE:                              OPERATIVE REPORT   PREOPERATIVE DIAGNOSIS:  Carpal tunnel syndrome, right hand.  POSTOPERATIVE DIAGNOSIS:  Carpal tunnel syndrome, right hand.  OPERATION:  Decompression, right median nerve.  SURGEON:  Cindee Salt, MD  ASSISTANT:  Betha Loa, MD  ANESTHESIA:  General.  ANESTHESIOLOGIST:  Zenon Mayo, MD  HISTORY:  The patient is a 51 year old female with history of carpal tunnel syndrome, EMG nerve conductions positive, which has not responded to conservative treatment.  She has elected to undergo surgical decompression.  Pre, peri, and postoperative course have been discussed along with risks and complications.  She is aware that there is no guarantee with surgery; possibility of infection; recurrence of injury to arteries, nerves, tendons, incomplete relief of symptoms, dystrophy. In the preoperative area, the patient is seen, the extremity marked by both the patient and surgeon.  Antibiotic given.  PROCEDURE:  The patient was brought to the operating room and general anesthetic carried out without difficulty.  She was prepped using ChloraPrep, supine position, right arm free.  A 3-minute dry time was allowed.  Time-out taken, confirming the patient and procedure.  A longitudinal incision was made in the palm, carried down through subcutaneous tissue.  Bleeders were electrocauterized.  Palmar fascia was split.  Superficial palmar arch identified.  Flexor tendon of the ring little finger identified.  To the ulnar side of the median nerve, the carpal retinaculum was incised with sharp dissection.  Right angle and Sewall retractor were placed between skin and forearm fascia.  The fascia  released for approximately a centimeter and a half proximal to the wrist crease under direct vision.  Air compression to the nerve was apparent.  No further lesions were identified.  The wound was irrigated. The skin was closed with interrupted 4-0 Vicryl Rapide sutures.  Local infiltration with 0.25% Marcaine without epinephrine was given, approximately 7 mL was used.  Sterile compressive dressing was applied with the fingers free.  On deflation of the tourniquet, all fingers immediately pinked.  She was taken to the recovery room for observation in satisfactory condition.  She will be discharged home to return to the [facility] of [place] in 1 week on Vicodin.          ______________________________ Cindee Salt, M.D.     GK/MEDQ  D:  02/14/2012  T:  02/15/2012  Job:  [id number]

## 2012-02-15 NOTE — Transfer of Care (Signed)
Immediate Anesthesia Transfer of Care Note  Patient: Tiffany Porter  Procedure(s) Performed: Procedure(s) (LRB) with comments: CARPAL TUNNEL RELEASE (Right)  Patient Location: PACU  Anesthesia Type:General  Level of Consciousness: awake, alert , oriented and patient cooperative  Airway & Oxygen Therapy: Patient Spontanous Breathing and Patient connected to face mask oxygen  Post-op Assessment: Report given to PACU RN and Post -op Vital signs reviewed and stable  Post vital signs: Reviewed and stable  Complications: No apparent anesthesia complications

## 2012-02-15 NOTE — Anesthesia Postprocedure Evaluation (Signed)
  Anesthesia Post-op Note  Patient: Tiffany Porter  Procedure(s) Performed: Procedure(s) (LRB) with comments: CARPAL TUNNEL RELEASE (Right)  Patient Location: PACU  Anesthesia Type:General  Level of Consciousness: awake, alert  and oriented  Airway and Oxygen Therapy: Patient Spontanous Breathing  Post-op Pain: none  Post-op Assessment: Post-op Vital signs reviewed, Patient's Cardiovascular Status Stable, Respiratory Function Stable, Patent Airway and No signs of Nausea or vomiting  Post-op Vital Signs: Reviewed and stable  Complications: No apparent anesthesia complications

## 2012-02-16 ENCOUNTER — Encounter (HOSPITAL_BASED_OUTPATIENT_CLINIC_OR_DEPARTMENT_OTHER): Payer: Self-pay | Admitting: Orthopedic Surgery

## 2012-03-29 ENCOUNTER — Other Ambulatory Visit: Payer: Self-pay | Admitting: Family Medicine

## 2012-04-25 ENCOUNTER — Other Ambulatory Visit: Payer: Self-pay | Admitting: Family Medicine

## 2012-05-06 ENCOUNTER — Other Ambulatory Visit: Payer: Self-pay | Admitting: Family Medicine

## 2012-11-30 ENCOUNTER — Other Ambulatory Visit: Payer: Self-pay | Admitting: Family Medicine

## 2013-02-25 ENCOUNTER — Other Ambulatory Visit: Payer: Self-pay | Admitting: Endocrinology

## 2013-02-25 DIAGNOSIS — I700 Atherosclerosis of aorta: Secondary | ICD-10-CM

## 2013-03-14 ENCOUNTER — Other Ambulatory Visit: Payer: 59

## 2013-03-14 ENCOUNTER — Ambulatory Visit
Admission: RE | Admit: 2013-03-14 | Discharge: 2013-03-14 | Disposition: A | Discharge status: Home / Self Care | Payer: 59 | Source: Ambulatory Visit | Attending: Endocrinology | Admitting: Endocrinology

## 2013-03-14 DIAGNOSIS — I700 Atherosclerosis of aorta: Secondary | ICD-10-CM

## 2014-04-02 ENCOUNTER — Other Ambulatory Visit: Payer: Self-pay

## 2014-04-02 ENCOUNTER — Other Ambulatory Visit: Payer: Self-pay | Admitting: Emergency Medicine

## 2014-04-02 DIAGNOSIS — Z1231 Encounter for screening mammogram for malignant neoplasm of breast: Secondary | ICD-10-CM

## 2014-04-23 ENCOUNTER — Ambulatory Visit: Payer: 59

## 2014-11-26 ENCOUNTER — Encounter: Payer: Self-pay | Admitting: Gastroenterology

## 2016-01-22 ENCOUNTER — Ambulatory Visit (INDEPENDENT_AMBULATORY_CARE_PROVIDER_SITE_OTHER): Payer: 59 | Admitting: Podiatry

## 2016-01-22 ENCOUNTER — Encounter: Payer: Self-pay | Admitting: Podiatry

## 2016-01-22 ENCOUNTER — Ambulatory Visit (INDEPENDENT_AMBULATORY_CARE_PROVIDER_SITE_OTHER): Payer: 59

## 2016-01-22 VITALS — BP 154/97 | HR 113 | Resp 16 | Ht 62.0 in | Wt 195.0 lb

## 2016-01-22 DIAGNOSIS — M779 Enthesopathy, unspecified: Secondary | ICD-10-CM | POA: Diagnosis not present

## 2016-01-22 DIAGNOSIS — M109 Gout, unspecified: Secondary | ICD-10-CM

## 2016-01-22 DIAGNOSIS — M25572 Pain in left ankle and joints of left foot: Secondary | ICD-10-CM

## 2016-01-22 LAB — C-REACTIVE PROTEIN: CRP: 17.2 mg/L — ABNORMAL HIGH (ref <8.0)

## 2016-01-22 NOTE — Progress Notes (Signed)
   Subjective:    Patient ID: Tiffany Porter, female    DOB: Sep 27, 1960, 55 y.o.   MRN: [id number]  HPI 55 year old female presents the office today for concerns of left ankle pain and swelling to his been ongoing since October 18. She's had some swelling to the area as well as pain when trying to move her ankle. She denies any recent injury or trauma. She denies any history of gout. She has Preiser disease of her left wrist. No numbness or tingling. No other complaints.   Review of Systems  Constitutional: Positive for fatigue.  Musculoskeletal: Positive for back pain.  Hematological: Bruises/bleeds easily.  All other systems reviewed and are negative.      Objective:   Physical Exam General: AAO x3, NAD  Dermatological: Skin is warm, dry and supple bilateral. Nails x 10 are well manicured; remaining integument appears unremarkable at this time. There are no open sores, no preulcerative lesions, no rash or signs of infection present.  Vascular: Dorsalis Pedis artery and Posterior Tibial artery pedal pulses are 2/4 bilateral with immedate capillary fill time. There is no pain with calf compression, swelling, warmth, erythema.   Neruologic: Grossly intact via light touch bilateral. Vibratory intact via tuning fork bilateral. Protective threshold with Semmes Wienstein monofilament intact to all pedal sites bilateral.  Musculoskeletal: There is localized edema to the left ankle on the medial aspect and there is faint warmth and redness in this area. There is no open sore identified there is no ascending cellulitis. No fluctuance or crepitus. There is pain with ankle joint range of motion. There is no specific area pinpoint bony tenderness however the majority appears to be within the medial ankle gutter. No other areas of tenderness. Muscular strength 5/5 in all groups tested bilateral.  Gait: Unassisted, Nonantalgic.      Assessment & Plan:  55 year old female left medial ankle pain,  capsulitis possible gout -Treatment options discussed including all alternatives, risks, and complications -Etiology of symptoms were discussed -X-rays were obtained and reviewed with the patient. No evidence of acute fracture -Understanding risks and complications she wishes to proceed with steroid injection to the left ankle. Under sterile Betadine preparation a mixture of dexamethasone and lidocaine was infiltrated into the medial gutter left ankle without complications. Post injection care was discussed. -Bloodwork ordered-ESR, CRP, Uric acid -Ankle brace. -Follow-up in 2-3 weeks or sooner if needed. Call any questions or concerns meantime.  Celesta Gentile, DPM

## 2016-01-23 LAB — URIC ACID: URIC ACID, SERUM: 9.7 mg/dL — AB (ref 2.5–7.0)

## 2016-01-23 LAB — SEDIMENTATION RATE: SED RATE: 12 mm/h (ref 0–30)

## 2016-01-28 ENCOUNTER — Telehealth: Payer: Self-pay | Admitting: *Deleted

## 2016-01-28 MED ORDER — COLCHICINE 0.6 MG PO CAPS: 1.0000 | ORAL_CAPSULE | Freq: Every day | ORAL | 0 refills | Status: AC

## 2016-01-28 MED ORDER — COLCHICINE 0.6 MG PO TABS: 0.6000 mg | ORAL_TABLET | Freq: Every day | ORAL | 0 refills | Status: DC

## 2016-01-28 NOTE — Telephone Encounter (Addendum)
-----   Message from Vivi BarrackMatthew R Wagoner, DPM sent at 01/28/2016  7:59 AM EDT ----- If not already done so can you please let her know that her uric acid is elevated and she has gout. Please call in colchicine 0.6mg  daily for 10 days. Follow up as scheduled. Left message on home phone to call for results and instructions. Informed pt of Dr. Gabriel RungWagoner's review of results and orders.

## 2016-01-28 NOTE — Telephone Encounter (Signed)
Received request to change Colchicine to Mitigar for insurance coverage. Dr. Ardelle AntonWagoner okayed change.

## 2016-02-05 ENCOUNTER — Encounter: Payer: Self-pay | Admitting: Podiatry

## 2016-02-05 ENCOUNTER — Ambulatory Visit (INDEPENDENT_AMBULATORY_CARE_PROVIDER_SITE_OTHER): Payer: 59 | Admitting: Podiatry

## 2016-02-05 DIAGNOSIS — M109 Gout, unspecified: Secondary | ICD-10-CM

## 2016-02-05 DIAGNOSIS — M779 Enthesopathy, unspecified: Secondary | ICD-10-CM | POA: Diagnosis not present

## 2016-02-05 MED ORDER — ALLOPURINOL 100 MG PO TABS: 100.0000 mg | ORAL_TABLET | Freq: Every day | ORAL | 6 refills | Status: AC

## 2016-02-05 NOTE — Patient Instructions (Addendum)
Gout Gout is an inflammatory arthritis caused by a buildup of uric acid crystals in the joints. Uric acid is a chemical that is normally present in the blood. When the level of uric acid in the blood is too high it can form crystals that deposit in your joints and tissues. This causes joint redness, soreness, and swelling (inflammation). Repeat attacks are common. Over time, uric acid crystals can form into masses (tophi) near a joint, destroying bone and causing disfigurement. Gout is treatable and often preventable. CAUSES  The disease begins with elevated levels of uric acid in the blood. Uric acid is produced by your body when it breaks down a naturally found substance called purines. Certain foods you eat, such as meats and fish, contain high amounts of purines. Causes of an elevated uric acid level include:  Being passed down from parent to child (heredity).  Diseases that cause increased uric acid production (such as obesity, psoriasis, and certain cancers).  Excessive alcohol use.  Diet, especially diets rich in meat and seafood.  Medicines, including certain cancer-fighting medicines (chemotherapy), water pills (diuretics), and aspirin.  Chronic kidney disease. The kidneys are no longer able to remove uric acid well.  Problems with metabolism. Conditions strongly associated with gout include:  Obesity.  High blood pressure.  High cholesterol.  Diabetes. Not everyone with elevated uric acid levels gets gout. It is not understood why some people get gout and others do not. Surgery, joint injury, and eating too much of certain foods are some of the factors that can lead to gout attacks. SYMPTOMS   An attack of gout comes on quickly. It causes intense pain with redness, swelling, and warmth in a joint.  Fever can occur.  Often, only one joint is involved. Certain joints are more commonly involved:  Base of the big toe.  Knee.  Ankle.  Wrist.  Finger. Without  treatment, an attack usually goes away in a few days to weeks. Between attacks, you usually will not have symptoms, which is different from many other forms of arthritis. DIAGNOSIS  Your caregiver will suspect gout based on your symptoms and exam. In some cases, tests may be recommended. The tests may include:  Blood tests.  Urine tests.  X-rays.  Joint fluid exam. This exam requires a needle to remove fluid from the joint (arthrocentesis). Using a microscope, gout is confirmed when uric acid crystals are seen in the joint fluid. TREATMENT  There are two phases to gout treatment: treating the sudden onset (acute) attack and preventing attacks (prophylaxis).  Treatment of an Acute Attack.  Medicines are used. These include anti-inflammatory medicines or steroid medicines.  An injection of steroid medicine into the affected joint is sometimes necessary.  The painful joint is rested. Movement can worsen the arthritis.  You may use warm or cold treatments on painful joints, depending which works best for you.  Treatment to Prevent Attacks.  If you suffer from frequent gout attacks, your caregiver may advise preventive medicine. These medicines are started after the acute attack subsides. These medicines either help your kidneys eliminate uric acid from your body or decrease your uric acid production. You may need to stay on these medicines for a very long time.  The early phase of treatment with preventive medicine can be associated with an increase in acute gout attacks. For this reason, during the first few months of treatment, your caregiver may also advise you to take medicines usually used for acute gout treatment. Be sure you   understand your caregiver's directions. Your caregiver may make several adjustments to your medicine dose before these medicines are effective.  Discuss dietary treatment with your caregiver or dietitian. Alcohol and drinks high in sugar and fructose and foods  such as meat, poultry, and seafood can increase uric acid levels. Your caregiver or dietitian can advise you on drinks and foods that should be limited. HOME CARE INSTRUCTIONS   Do not take aspirin to relieve pain. This raises uric acid levels.  Only take over-the-counter or prescription medicines for pain, discomfort, or fever as directed by your caregiver.  Rest the joint as much as possible. When in bed, keep sheets and blankets off painful areas.  Keep the affected joint raised (elevated).  Apply warm or cold treatments to painful joints. Use of warm or cold treatments depends on which works best for you.  Use crutches if the painful joint is in your leg.  Drink enough fluids to keep your urine clear or pale yellow. This helps your body get rid of uric acid. Limit alcohol, sugary drinks, and fructose drinks.  Follow your dietary instructions. Pay careful attention to the amount of protein you eat. Your daily diet should emphasize fruits, vegetables, whole grains, and fat-free or low-fat milk products. Discuss the use of coffee, vitamin C, and cherries with your caregiver or dietitian. These may be helpful in lowering uric acid levels.  Maintain a healthy body weight. SEEK MEDICAL CARE IF:   You develop diarrhea, vomiting, or any side effects from medicines.  You do not feel better in 24 hours, or you are getting worse. SEEK IMMEDIATE MEDICAL CARE IF:   Your joint becomes suddenly more tender, and you have chills or a fever. MAKE SURE YOU:   Understand these instructions.  Will watch your condition.  Will get help right away if you are not doing well or get worse.   This information is not intended to replace advice given to you by your health care provider. Make sure you discuss any questions you have with your health care provider.   Document Released: 03/18/2000 Document Revised: 04/11/2014 Document Reviewed: 11/02/2011 Elsevier Interactive Patient Education 2016  [employer]  Allopurinol tablets What is this medicine? ALLOPURINOL (al oh PURE i nole) reduces the amount of uric acid the body makes. It is used to treat the symptoms of gout. It is also used to treat or prevent high uric acid levels that occur as a result of certain types of chemotherapy. This medicine may also help patients who frequently have kidney stones. This medicine may be used for other purposes; ask your health care provider or pharmacist if you have questions. What should I tell my health care provider before I take this medicine? They need to know if you have any of these conditions: -kidney or liver disease -an unusual or allergic reaction to allopurinol, other medicines, foods, dyes, or preservatives -pregnant or trying to get pregnant -breast feeding How should I use this medicine? Take this medicine by mouth with a glass of water. Follow the directions on the prescription label. If this medicine upsets your stomach, take it with food or milk. Take your doses at regular intervals. Do not take your medicine more often than directed. Talk to your pediatrician regarding the use of this medicine in children. Special care may be needed. While this drug may be prescribed for children as young as 6 years for selected conditions, precautions do apply. Overdosage: If you think you have taken too much of  this medicine contact a poison control center or emergency room at once. NOTE: This medicine is only for you. Do not share this medicine with others. What if I miss a dose? If you miss a dose, take it as soon as you can. If it is almost time for your next dose, take only that dose. Do not take double or extra doses. What may interact with this medicine? Do not take this medicine with the following medication: -didanosine, ddI This medicine may also interact with the following medications: -amoxicillin or ampicillin -azathioprine -certain medicines used to treat gout -certain types  of diuretics -chlorpropamide -cyclosporine -dicumarol -mercaptopurine -tolbutamide -warfarin This list may not describe all possible interactions. Give your health care provider a list of all the medicines, herbs, non-prescription drugs, or dietary supplements you use. Also tell them if you smoke, drink alcohol, or use illegal drugs. Some items may interact with your medicine. What should I watch for while using this medicine? Visit your doctor or health care professional for regular checks on your progress. If you are taking this medicine to treat gout, you may not have less frequent attacks at first. Keep taking your medicine regularly and the attacks should get better within 2 to 6 weeks. Drink plenty of water (10 to 12 full glasses a day) while you are taking this medicine. This will help to reduce stomach upset and reduce the risk of getting gout or kidney stones. Call your doctor or health care professional at once if you get a skin rash together with chills, fever, sore throat, or nausea and vomiting, if you have blood in your urine, or difficulty passing urine. Do not take vitamin C without asking your doctor or health care professional. Too much vitamin C can increase the chance of getting kidney stones. You may get drowsy or dizzy. Do not drive, use machinery, or do anything that needs mental alertness until you know how this drug affects you. Do not stand or sit up quickly, especially if you are an older patient. This reduces the risk of dizzy or fainting spells. Alcohol can make you more drowsy and dizzy. Alcohol can also increase the chance of stomach problems and increase the amount of uric acid in your blood. Avoid alcoholic drinks. What side effects may I notice from receiving this medicine? Side effects that you should report to your doctor or health care professional as soon as possible: -allergic reactions like skin rash, itching or hives, swelling of the face, lips, or  tongue -breathing problems -muscle aches or pains -redness, blistering, peeling or loosening of the skin, including inside the mouth Side effects that usually do not require medical attention (report to your doctor or health care professional if they continue or are bothersome): -changes in taste -diarrhea -indigestion -stomach pain or cramps This list may not describe all possible side effects. Call your doctor for medical advice about side effects. You may report side effects to FDA at 1-800-FDA-1088. Where should I keep my medicine? Keep out of the reach of children. Store at room temperature between 15 and 25 degrees C (59 and 77 degrees F). Protect from light and moisture. Throw away any unused medicine after the expiration date. NOTE: This sheet is a summary. It may not cover all possible information. If you have questions about this medicine, talk to your doctor, pharmacist, or health care provider.    2016, Elsevier/Gold Standard. (2007-09-24 14:26:54)

## 2016-02-07 NOTE — Progress Notes (Signed)
Subjective: Patient presents the office today for follow up evaluation of left ankle pain. She states that since started a new medication for gout, cultures seen she has had improvement of her symptoms. She still gets some mild discomfort and ankle but it overall does is substantially improved. She does recall a history of the scapula couple times previously where she wakes up in the morning her ankle hurts quite a bit and over the couple days the pain resolves. This time it lasted longer than normal. Denies any systemic complaints such as fevers, chills, nausea, vomiting. No acute changes since last appointment, and no other complaints at this time.   Objective: AAO x3, NAD DP/PT pulses palpable bilaterally, CRT less than 3 seconds There is minimal edema to the left ankle on medial aspect and there is no erythema or increase in warmth. There is no significant tenderness palpation to the area at this time there is no pain or restriction of ankle joint range of motion. No area pinpoint bony tenderness or pain the vibratory sensation. No open lesions or pre-ulcerative lesions.  No pain with calf compression, swelling, warmth, erythema  Assessment: Resolving gout left ankle  Plan: -All treatment options discussed with the patient including all alternatives, risks, complications.  -Blood results were discussed with the patient. Given her history as well as increased uric acid once she finishes the cultures a we will continue to start allopurinol 100 mg daily. I'll see her back in 3-4 weeks as she is doing likely repeat blood work [employer]. Discussed diet modifications as well. -Patient encouraged to call the office with any questions, concerns, change in symptoms.   Ovid CurdMatthew Johnaton Sonneborn, DPM

## 2016-03-04 ENCOUNTER — Ambulatory Visit (INDEPENDENT_AMBULATORY_CARE_PROVIDER_SITE_OTHER): Payer: 59 | Admitting: Podiatry

## 2016-03-04 ENCOUNTER — Encounter: Payer: Self-pay | Admitting: Podiatry

## 2016-03-04 DIAGNOSIS — M1A0720 Idiopathic chronic gout, left ankle and foot, without tophus (tophi): Secondary | ICD-10-CM | POA: Diagnosis not present

## 2016-03-04 NOTE — Patient Instructions (Signed)
[facility] Ask your health care provider which exercises are safe for you. Do exercises exactly as told by your health care provider and adjust them as directed. It is normal to feel mild stretching, pulling, tightness, or discomfort as you do these exercises, but you should stop right away if you feel sudden pain or your pain gets worse. Do not begin these exercises until told by your health care provider. Stretching and range of motion exercises These exercises warm up your muscles and joints and improve the movement and flexibility of your ankle. These exercises also help to relieve pain, numbness, and tingling. Exercise A: Standing wall calf stretch, knee straight 1. Stand with your hands against a wall. 2. Extend your __________ leg behind you and bend your front knee slightly. Keep both of your heels on the floor. 3. Point the toes of your back foot slightly inward. 4. Keeping your heels on the floor and your back knee straight, shift your weight toward the wall. Do not allow your back to arch. You should feel a gentle stretch in your calf. 5. Hold this position for seconds. Repeat __________ times. Complete this stretch __________ times per day. Exercise B: Standing wall calf stretch, knee bent 1. Stand with your hands against a wall. 2. Extend your __________ leg behind you, and bend your front knee slightly. Keep both of your heels on the floor. 3. Point the toes of your back foot slightly inward. 4. Keeping your heels on the floor, unlock your back knee so that it is bent. You should feel a gentle stretch deep in your calf. 5. Hold this position for __________ seconds. Repeat __________ times. Complete this stretch __________ times per day. Strengthening exercises These exercises build strength and control of your ankle. Endurance is the ability to use your muscles for a long time, even after they get tired. Exercise C: Plantar flexion with band 1. Sit on the floor with  your __________ leg extended. You may put a pillow under your calf to give your foot more room to move. 2. Loop a rubber exercise band or tube around the ball of your __________ foot. The ball of your foot is on the walking surface, right under your toes. The band or tube should be slightly tense when your foot is relaxed. If the band or tube slips, you can put on your shoe or put a washcloth between the band and your foot to help it stay in place. 3. Slowly point your toes downward, pushing them away from you. 4. Hold this position for __________ seconds. 5. Slowly release the tension in the band or tube, controlling smoothly until your foot is back to the starting position. Repeat __________ times. Complete this exercise __________ times per day. Exercise D: Heel raise with eccentric lower 1. Stand on a step with the balls of your feet. The ball of your foot is on the walking surface, right under your toes.  Do not put your heels on the step.  For balance, rest your hands on the wall or on a railing. 2. Rise up onto the balls of your feet. 3. Keeping your heels up, shift all of your weight to your __________ leg and pick up your other leg. 4. Slowly lower your __________ leg so your heel drops below the level of the step. 5. Put down your foot. If told by your health care provider, build up to:  3 sets of 15 repetitions while keeping your knees straight.  3   sets of 15 repetitions while keeping your knees bent as far as told by your health care provider. Complete this exercise __________ times per day. If this exercise is too easy, try doing it while wearing a backpack with weights in it. Balance exercises These exercises improve or maintain your balance. Balance is important in preventing falls. Exercise E: Single leg stand 1. Without shoes, stand near a railing or in a door frame. Hold on to the railing or door frame as needed. 2. Stand on your __________ foot. Keep your big toe down on  the floor and try to keep your arch lifted. 3. Hold this position for __________ seconds. Repeat __________ times. Complete this exercise __________ times per day. If this exercise is too easy, you can try it with your eyes closed or while standing on a pillow. This information is not intended to replace advice given to you by your health care provider. Make sure you discuss any questions you have with your health care provider. Document Released: 10/20/2004 Document Revised: 11/26/2015 Document Reviewed: 11/25/2014 Elsevier Interactive Patient Education  2017 [employer]    Information for patients with Gout  Gout defined-Gout occurs when urate crystals accumulate in your joint causing the inflammation and intense pain of gout attack.  Urate crystals can form when you have high levels of uric acid in your blood.  Your body produces uric acid when it breaks down prurines-substances that are found naturally in your body, as well as in certain foods such as organ meats, anchioves, herring, asparagus, and mushrooms.  Normally uric acid dissolves in your blood and passes through your kidneys into your urine.  But sometimes your body either produces too much uric acid or your kidneys excrete too little uric acid.  When this happens, uric acid can build up, forming sharp needle-like urate crystals in a joint or surrounding tissue that cause pain, inflammation and swelling.    Gout is characterized by sudden, severe attacks of pain, redness and tenderness in joints, often the joint at the base of the big toe.  Gout is complex form of arthritis that can affect anyone.  Men are more likely to get gout but women become increasingly more susceptible to gout after menopause.  An acute attack of gout can wake you up in the middle of the night with the sensation that your big toe is on fire.  The affected joint is hot, swollen and so tender that even the weight or the sheet on it may seem intolerable.  If  you experience symptoms of an acute gout attack it is important to your doctor as soon as the symptoms start.  Gout that goes untreated can lead to worsening pain and joint damage.  Risk Factors:  You are more likely to develop gout if you have high levels of uric acid in your body.    Factors that increase the uric acid level in your body include:  Lifestyle factors.  Excessive alcohol use-generally more than two drinks a day for men and more than one for women increase the risk of gout.  Medical conditions.  Certain conditions make it more likely that you will develop gout.  These include hypertension, and chronic conditions such as diabetes, high levels of fat and cholesterol in the blood, and narrowing of the arteries.  Certain medications.  The uses of Thiazide diuretics- commonly used to treat hypertension and low dose aspirin can also increase uric acid levels.  Family history of gout.  If other  members of your family have had gout, you are more likely to develop the disease.  Age and sex. Gout occurs more often in men than it does in women, primarily because women tend to have lower uric acid levels than men do.  Men are more likely to develop gout earlier usually between the ages of 5340-50- whereas women generally develop signs and symptoms after menopause.    Tests and diagnosis:  Tests to help diagnose gout may include:  Blood test.  Your doctor may recommend a blood test to measure the uric acid level in your blood .  Blood tests can be misleading, though.  Some people have high uric acid levels but never experience gout.  And some people have signs and symptoms of gout, but don't have unusual levels of uric acid in their blood.  Joint fluid test.  Your doctor may use a needle to draw fluid from your affected joint.  When examined under the microscope, your joint fluid may reveal urate crystals.  Treatment:  Treatment for gout usually involves medications.  What medications you  and your doctor choose will be based on your current health and other medications you currently take.  Gout medications can be used to treat acute gout attacks and prevent future attacks as well as reduce your risk of complications from gout such as the development of tophi from urate crystal deposits.  Alternative medicine:   Certain foods have been studied for their potential to lower uric acid levels, including:  Coffee.  Studies have found an association between coffee drinking (regular and decaf) and lower uric acid levels.  The evidence is not enough to encourage non-coffee drinkers to start, but it may give clues to new ways of treating gout in the future.  Vitamin C.  Supplements containing vitamin C may reduce the levels of uric acid in your blood.  However, vitamin as a treatment for gout. Don't assume that if a little vitamin C is good, than lots is better.  Megadoses of vitamin C may increase your bodies uric acid levels.  Cherries.  Cherries have been associated with lower levels of uric acid in studies, but it isn't clear if they have any effect on gout signs and symptoms.  Eating more cherries and other dar-colored fruits, such as blackberries, blueberries, purple grapes and raspberries, may be a safe way to support your gout treatment.    Lifestyle/Diet Recommendations:   Drink 8 to 16 cups ( about 2 to 4 liters) of fluid each day, with at least half being water.  Avoid alcohol  Eat a moderate amount of protein, preferably from healthy sources, such as low-fat or fat-free dairy, tofu, eggs, and nut butters.  Limit you daily intake of meat, fish, and poultry to 4 to 6 ounces.  Avoid high fat meats and desserts.  Decrease you intake of shellfish, beef, lamb, pork, eggs and cheese.  Choose a good source of vitamin C daily such as citrus fruits, strawberries, broccoli,  brussel sprouts, papaya, and cantaloupe.   Choose a good source of vitamin A every other day such as yellow  fruits, or dark green/yellow vegetables.  Avoid drastic weigh reduction or fasting.  If weigh loss is desired lose it over a period of several months.  See "dietary considerations.." chart for specific food recommendations.  Dietary Considerations for people with Gout  Food with negligible purine content (0-15 mg of purine nitrogen per 100 grams food)  May use as desired except on calorie variations  Non fat milk Cocoa Cereals (except in list II) Hard candies  Buttermilk Carbonated drinks Vegetables (except in list II) Sherbet  Coffee Fruits Sugar Honey  Tea Cottage Cheese Gelatin-jell-o Salt  Fruit juice Breads Angel food Cake   Herbs/spices Jams/Jellies [employer]    Foods that do not contain excessive purine content, but must be limited due to fat content  Cream Eggs Oil and Salad Dressing  Half and Half Peanut Butter Chocolate  Whole Milk Cakes Potato Chips  Butter Ice Cream Fried Foods  Cheese Nuts Waffles, pancakes   List II: Food with moderate purine content (50-150 mg of purine nitrogen per 100 grams of food)  Limit total amount each day to 5 oz. cooked Lean meat, other than those on list III   Poultry, other than those on list III Fish, other than those on list III   Seafood, other than those on list III  These foods may be used occasionally  Peas Lentils Bran  Spinach Oatmeal Dried Beans and Peas  Asparagus Wheat Germ Mushrooms   Additional information about meat choices  Choose fish and poultry, particularly without skin, often.  Select lean, well trimmed cuts of meat.  Avoid all fatty meats, bacon , sausage, fried meats, fried fish, or poultry, luncheon meats, cold cuts, hot dogs, meats canned or frozen in gravy, spareribs and frozen and packaged prepared meats.   List III: Foods with HIGH purine content / Foods to AVOID (150-800 mg of purine nitrogen per 100 grams of food)  Anchovies Herring Meat Broths  Liver Mackerel Meat Extracts  Kidney  Scallops Meat Drippings  Sardines Wild Game Mincemeat  Sweetbreads Goose Gravy  Heart Tongue Yeast, baker's and brewers   Commercial soups made with any of the foods listed in List II or List III  In addition avoid all alcoholic beverages

## 2016-03-09 LAB — URIC ACID: Uric Acid, Serum: 6.4 mg/dL (ref 2.5–7.0)

## 2016-03-10 ENCOUNTER — Telehealth: Payer: Self-pay | Admitting: *Deleted

## 2016-03-10 DIAGNOSIS — M1A0720 Idiopathic chronic gout, left ankle and foot, without tophus (tophi): Secondary | ICD-10-CM | POA: Insufficient documentation

## 2016-03-10 NOTE — Progress Notes (Signed)
Subjective:  55 year old female presents the office today for follow-up evaluation left ankle pain, gout. She states that the left ankle is doing well. She gets some occasional discomfort but she stands all day at work but overall she is doing much better. She is continuing allopurinol she is not had any recent redness or swelling or any warmth to her foot. Denies any systemic complaints such as fevers, chills, nausea, vomiting. No acute changes since last appointment, and no other complaints at this time.   Objective: AAO x3, NAD DP/PT pulses palpable bilaterally, CRT less than 3 seconds Business navicular edema to the left ankle and there is no erythema or increase in warmth. There is no pain with ankle joint range of motion and crepitation. There is no area pinpoint bony tenderness or pain the vibratory sensation. There is no snapping and discomfort to her ankle at this time. No other areas of tenderness bilaterally. No open lesions or pre-ulcerative lesions.  No pain with calf compression, swelling, warmth, erythema  Assessment: Resolving gout left ankle  Plan: -All treatment options discussed with the patient including all alternatives, risks, complications.  -Overall she appears to be improving. She is continued without apparent all. We're to recheck her uric acid level today to make sure it has him down. Also discussed with her diet modifications as well as an ankle brace as needed also discussed that she modifications and orthotics. Monitor for any reoccurrence rate flares of gout. -Patient encouraged to call the office with any questions, concerns, change in symptoms.   Ovid CurdMatthew Jaterrius Ricketson, DPM

## 2016-03-10 NOTE — Telephone Encounter (Addendum)
-----   Message from Vivi BarrackMatthew R Wagoner, DPM sent at 03/09/2016  4:37 PM EST ----- Uric acid level has returned to normal. Stay on allopurinol at same dose. 03/10/2016-Informed pt of Dr. Gabriel RungWagoner's orders.

## 2016-05-16 ENCOUNTER — Emergency Department (HOSPITAL_COMMUNITY): Payer: 59

## 2016-05-16 ENCOUNTER — Encounter (HOSPITAL_COMMUNITY): Payer: Self-pay | Admitting: *Deleted

## 2016-05-16 ENCOUNTER — Emergency Department (HOSPITAL_COMMUNITY)
Admission: EM | Admit: 2016-05-16 | Discharge: 2016-05-16 | Disposition: A | Discharge status: Home / Self Care | Payer: 59 | Attending: Emergency Medicine | Admitting: Emergency Medicine

## 2016-05-16 DIAGNOSIS — Z87891 Personal history of nicotine dependence: Secondary | ICD-10-CM | POA: Diagnosis not present

## 2016-05-16 DIAGNOSIS — E860 Dehydration: Secondary | ICD-10-CM | POA: Diagnosis not present

## 2016-05-16 DIAGNOSIS — I10 Essential (primary) hypertension: Secondary | ICD-10-CM | POA: Insufficient documentation

## 2016-05-16 DIAGNOSIS — E039 Hypothyroidism, unspecified: Secondary | ICD-10-CM | POA: Insufficient documentation

## 2016-05-16 DIAGNOSIS — R55 Syncope and collapse: Secondary | ICD-10-CM | POA: Diagnosis present

## 2016-05-16 LAB — BASIC METABOLIC PANEL
ANION GAP: 13 (ref 5–15)
BUN: 22 mg/dL — AB (ref 6–20)
CHLORIDE: 98 mmol/L — AB (ref 101–111)
CO2: 27 mmol/L (ref 22–32)
Calcium: 9.5 mg/dL (ref 8.9–10.3)
Creatinine, Ser: 1.14 mg/dL — ABNORMAL HIGH (ref 0.44–1.00)
GFR calc non Af Amer: 53 mL/min — ABNORMAL LOW (ref >60)
Glucose, Bld: 97 mg/dL (ref 65–99)
POTASSIUM: 3.5 mmol/L (ref 3.5–5.1)
SODIUM: 138 mmol/L (ref 135–145)

## 2016-05-16 LAB — CBC
HEMATOCRIT: 41.6 % (ref 36.0–46.0)
Hemoglobin: 14.4 g/dL (ref 12.0–15.0)
MCH: 31.9 pg (ref 26.0–34.0)
MCHC: 34.6 g/dL (ref 30.0–36.0)
MCV: 92 fL (ref 78.0–100.0)
Platelets: 365 10*3/uL (ref 150–400)
RBC: 4.52 MIL/uL (ref 3.87–5.11)
RDW: 13.1 % (ref 11.5–15.5)
WBC: 10.4 10*3/uL (ref 4.0–10.5)

## 2016-05-16 LAB — URINALYSIS, ROUTINE W REFLEX MICROSCOPIC
BILIRUBIN URINE: NEGATIVE
Glucose, UA: NEGATIVE mg/dL
HGB URINE DIPSTICK: NEGATIVE
KETONES UR: NEGATIVE mg/dL
Nitrite: NEGATIVE
Protein, ur: NEGATIVE mg/dL
Specific Gravity, Urine: 1.011 (ref 1.005–1.030)
pH: 6 (ref 5.0–8.0)

## 2016-05-16 LAB — I-STAT TROPONIN, ED
Troponin i, poc: 0 ng/mL (ref 0.00–0.08)
Troponin i, poc: 0 ng/mL (ref 0.00–0.08)

## 2016-05-16 LAB — I-STAT CHEM 8, ED
BUN: 24 mg/dL — AB (ref 6–20)
CALCIUM ION: 1.08 mmol/L — AB (ref 1.15–1.40)
CHLORIDE: 97 mmol/L — AB (ref 101–111)
Creatinine, Ser: 1.2 mg/dL — ABNORMAL HIGH (ref 0.44–1.00)
GLUCOSE: 93 mg/dL (ref 65–99)
HCT: 43 % (ref 36.0–46.0)
Hemoglobin: 14.6 g/dL (ref 12.0–15.0)
POTASSIUM: 3.3 mmol/L — AB (ref 3.5–5.1)
Sodium: 137 mmol/L (ref 135–145)
TCO2: 30 mmol/L (ref 0–100)

## 2016-05-16 LAB — CBG MONITORING, ED: GLUCOSE-CAPILLARY: 87 mg/dL (ref 65–99)

## 2016-05-16 LAB — D-DIMER, QUANTITATIVE (NOT AT ARMC): D DIMER QUANT: 0.36 ug{FEU}/mL (ref 0.00–0.50)

## 2016-05-16 MED ORDER — SODIUM CHLORIDE 0.9 % IV BOLUS (SEPSIS)
2000.0000 mL | Freq: Once | INTRAVENOUS | Status: AC
  Administered 2016-05-16: 2000 mL via INTRAVENOUS

## 2016-05-16 MED ORDER — SODIUM CHLORIDE 0.9 % IV SOLN
INTRAVENOUS | Status: DC
  Administered 2016-05-16: 17:00:00 via INTRAVENOUS

## 2016-05-16 MED ORDER — POTASSIUM CHLORIDE CRYS ER 20 MEQ PO TBCR
20.0000 meq | EXTENDED_RELEASE_TABLET | Freq: Once | ORAL | Status: AC
  Administered 2016-05-16: 20 meq via ORAL
  Filled 2016-05-16: qty 1

## 2016-05-16 NOTE — ED Notes (Signed)
Pt CBG was 87, notified Hayden(RN)

## 2016-05-16 NOTE — ED Provider Notes (Signed)
[facility]-EMERGENCY DEPT Provider Note   CSN: [id number] Arrival date & time: 05/16/16  1230     History   Chief Complaint Chief Complaint  Patient presents with  . Near Syncope  . Shortness of Breath    HPI Michiel SitesGina D Whittinghill is a 56 y.o. female.  56 year old female presents with near syncope 2. Symptoms began 2 days ago when she was sitting down and felt infection tunnel vision. Denied any chest pain or shortness of breath. To become ages after this. Denies any small volume loss or change in her medications. Does have a history of hypertension and does take diuretics. Today the symptoms came back again which went to stand up and felt like she was going to pass out. He denied loss of consciousness. No recent leg pain or trouble history. States that she feels better at this time. No treatment used prior to arrival.      Past Medical History:  Diagnosis Date  . Contact lens/glasses fitting    wears contacts  or glasses  . GERD (gastroesophageal reflux disease)   . Hypertension   . Nontoxic multinodular goiter   . 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 Scarlet fever    as a child  . Thyroid disease     Patient Active Problem List   Diagnosis Date Noted  . Chronic gout of left foot 03/10/2016  . HYPOTHYROIDISM 09/03/2009  . GERD 09/03/2009  . FATIGUE 09/03/2009  . OBESITY 07/18/2008  . HYPERTENSION 06/18/2008    Past Surgical History:  Procedure Laterality Date  . CARPAL TUNNEL RELEASE  02/14/2012   Procedure: CARPAL TUNNEL RELEASE;  Surgeon: Nicki ReaperGary R Kuzma, MD;  Location: [facility];  Service: Orthopedics;  Laterality: Right;  . TOTAL THYROIDECTOMY  06/08  . WRIST SURGERY  08/08   left wrist surgery for preisers disease    OB History    No data available       Home Medications    Prior to Admission medications   Medication Sig Start Date End Date Taking? Authorizing Provider  allopurinol (ZYLOPRIM) 100 MG tablet Take 1 tablet (100 mg total) by  mouth daily. 02/05/16   Vivi BarrackMatthew R Wagoner, DPM  celecoxib (CELEBREX) 100 MG capsule 200 mg. PATIENT UNSURE OF DOSE     Historical Provider, MD  Colchicine (MITIGARE) 0.6 MG CAPS Take 1 capsule by mouth daily. 01/28/16   Vivi BarrackMatthew R Wagoner, DPM  furosemide (LASIX) 20 MG tablet TAKE 1 TABLET BY MOUTH EVERY DAY -- NEED OFFICE VISIT FOR FURTHER REFILLS 11/30/12   Hannah BeatSpencer Copland, MD  HYDROcodone-acetaminophen (NORCO/VICODIN) 5-325 MG tablet Take 1 tablet by mouth every 6 (six) hours as needed.  01/19/16   Historical Provider, MD  levothyroxine (SYNTHROID, LEVOTHROID) 112 MCG tablet Take 112 mcg by mouth daily.    Historical Provider, MD  traMADol (ULTRAM) 50 MG tablet Take 50 mg by mouth every 6 (six) hours as needed.     Historical Provider, MD  valsartan-hydrochlorothiazide (DIOVAN-HCT) 160-12.5 MG per tablet TAKE 1 TABLET BY MOUTH EVERY DAY IN THE MORNING FOR BLOOD PRESSURE 11/30/12   Hannah BeatSpencer Copland, MD    Family History No family history on file.  Social History Social History  Substance Use Topics  . Smoking status: Former Smoker    Types: Cigarettes    Quit date: 02/09/2003  . Smokeless tobacco: Never Used  . Alcohol use Yes     Comment: occ     Allergies   Penicillins and  Sulfonamide derivatives   Review of Systems Review of Systems  All other systems reviewed and are negative.    Physical Exam Updated Vital Signs BP 127/73 (BP Location: Left Arm)   Pulse 85   Temp 98 F (36.7 C) (Oral)   Resp 16   Ht 5\' 2"  (1.575 m)   Wt 88.5 kg   SpO2 100%   BMI 35.67 kg/m   Physical Exam  Constitutional: She is oriented to person, place, and time. She appears well-developed and well-nourished.  Non-toxic appearance. No distress.  HENT:  Head: Normocephalic and atraumatic.  Eyes: Conjunctivae, EOM and lids are normal. Pupils are equal, round, and reactive to light.  Neck: Normal range of motion. Neck supple. No tracheal deviation present. No thyroid mass present.    Cardiovascular: Normal rate, regular rhythm and normal heart sounds.  Exam reveals no gallop.   No murmur heard. Pulmonary/Chest: Effort normal and breath sounds normal. No stridor. No respiratory distress. She has no decreased breath sounds. She has no wheezes. She has no rhonchi. She has no rales.  Abdominal: Soft. Normal appearance and bowel sounds are normal. She exhibits no distension. There is no tenderness. There is no rebound and no CVA tenderness.  Musculoskeletal: Normal range of motion. She exhibits no edema or tenderness.  Neurological: She is alert and oriented to person, place, and time. She has normal strength. No cranial nerve deficit or sensory deficit. GCS eye subscore is 4. GCS verbal subscore is 5. GCS motor subscore is 6.  Skin: Skin is warm and dry. No abrasion and no rash noted.  Psychiatric: She has a normal mood and affect. Her speech is normal and behavior is normal.  Nursing note and vitals reviewed.    ED Treatments / Results  Labs (all labs ordered are listed, but only abnormal results are displayed) Labs Reviewed  BASIC METABOLIC PANEL - Abnormal; Notable for the following:       Result Value   Chloride 98 (*)    BUN 22 (*)    Creatinine, Ser 1.14 (*)    GFR calc non Af Amer 53 (*)    All other components within normal limits  I-STAT CHEM 8, ED - Abnormal; Notable for the following:    Potassium 3.3 (*)    Chloride 97 (*)    BUN 24 (*)    Creatinine, Ser 1.20 (*)    Calcium, Ion 1.08 (*)    All other components within normal limits  CBC  URINALYSIS, ROUTINE W REFLEX MICROSCOPIC  I-STAT TROPOININ, ED  CBG MONITORING, ED  I-STAT TROPOININ, ED    EKG  EKG Interpretation  Date/Time:  Monday May 16 2016 12:34:16 EST Ventricular Rate:  98 PR Interval:  148 QRS Duration: 86 QT Interval:  368 QTC Calculation: 469 R Axis:   54 Text Interpretation:  Normal sinus rhythm Normal ECG No significant change since last tracing Confirmed by Jervis Trapani  MD,  Mairi Stagliano ([id number]) on 05/16/2016 4:06:12 PM       Radiology Dg Chest 2 View  Result Date: 05/16/2016 CLINICAL DATA:  Episodes of shortness of breath and slurred speech and near syncope with most recent episode today. History of hypertension, discontinue smoking fifteen years ago. EXAM: CHEST  2 VIEW COMPARISON:  Chest x-ray of September 15, 2006 FINDINGS: The lungs are adequately inflated and clear. The heart and pulmonary vascularity are normal. The mediastinum is normal in width. There is tortuosity of the ascending and descending thoracic aorta with calcification in the  wall of the aortic arch. There is mild multilevel degenerative disc disease of the thoracic spine. IMPRESSION: There is no pneumonia, CHF, nor other acute cardiopulmonary disease. Thoracic aortic atherosclerosis. Electronically Signed   By: David  [place] M.D.   On: 05/16/2016 13:15    Procedures Procedures (including critical care time)  Medications Ordered in ED Medications - No data to display   Initial Impression / Assessment and Plan / ED Course  I have reviewed the triage vital signs and the nursing notes.  Pertinent labs & imaging results that were available during my care of the patient were reviewed by me and considered in my medical decision making (see chart for details).     Pt given fluids and feels better Suspect sx from volume depletion D-dimer neg, No concern for pe Stable for d/c    Final Clinical Impressions(s) / ED Diagnoses   Final diagnoses:  None    New Prescriptions New Prescriptions   No medications on file     Lorre Nick, MD 05/16/16 1819

## 2016-05-16 NOTE — ED Notes (Signed)
Ambulated pt to restroom, no issues. Pt back on monitor. 

## 2016-05-16 NOTE — ED Triage Notes (Addendum)
Pt states 2 episodes of feeling as if she was going to black out, lasting 30 min.  Today whenever she was about to stand up, everything became dark and she became sob.  States increasing R back pain.  Also c/o sternal chest pressure.

## 2016-09-14 ENCOUNTER — Other Ambulatory Visit: Payer: Self-pay | Admitting: Pain Medicine

## 2016-09-14 ENCOUNTER — Ambulatory Visit
Admission: RE | Admit: 2016-09-14 | Discharge: 2016-09-14 | Disposition: A | Discharge status: Home / Self Care | Payer: PRIVATE HEALTH INSURANCE | Source: Ambulatory Visit | Attending: Pain Medicine | Admitting: Pain Medicine

## 2016-09-14 DIAGNOSIS — M549 Dorsalgia, unspecified: Secondary | ICD-10-CM

## 2017-07-25 ENCOUNTER — Other Ambulatory Visit: Payer: Self-pay | Admitting: Pain Medicine

## 2017-07-25 DIAGNOSIS — M545 Low back pain: Secondary | ICD-10-CM

## 2018-02-13 IMAGING — DX DG CHEST 2V
2 series · 2 of 2 positions shown · non-contrast
Comparison: Chest x-ray of September 15, 2006

CLINICAL DATA: Episodes of shortness of breath and slurred speech
and near syncope with most recent episode today. History of
hypertension, discontinue smoking fifteen years ago.

EXAM:
CHEST  2 VIEW

[chest pa]
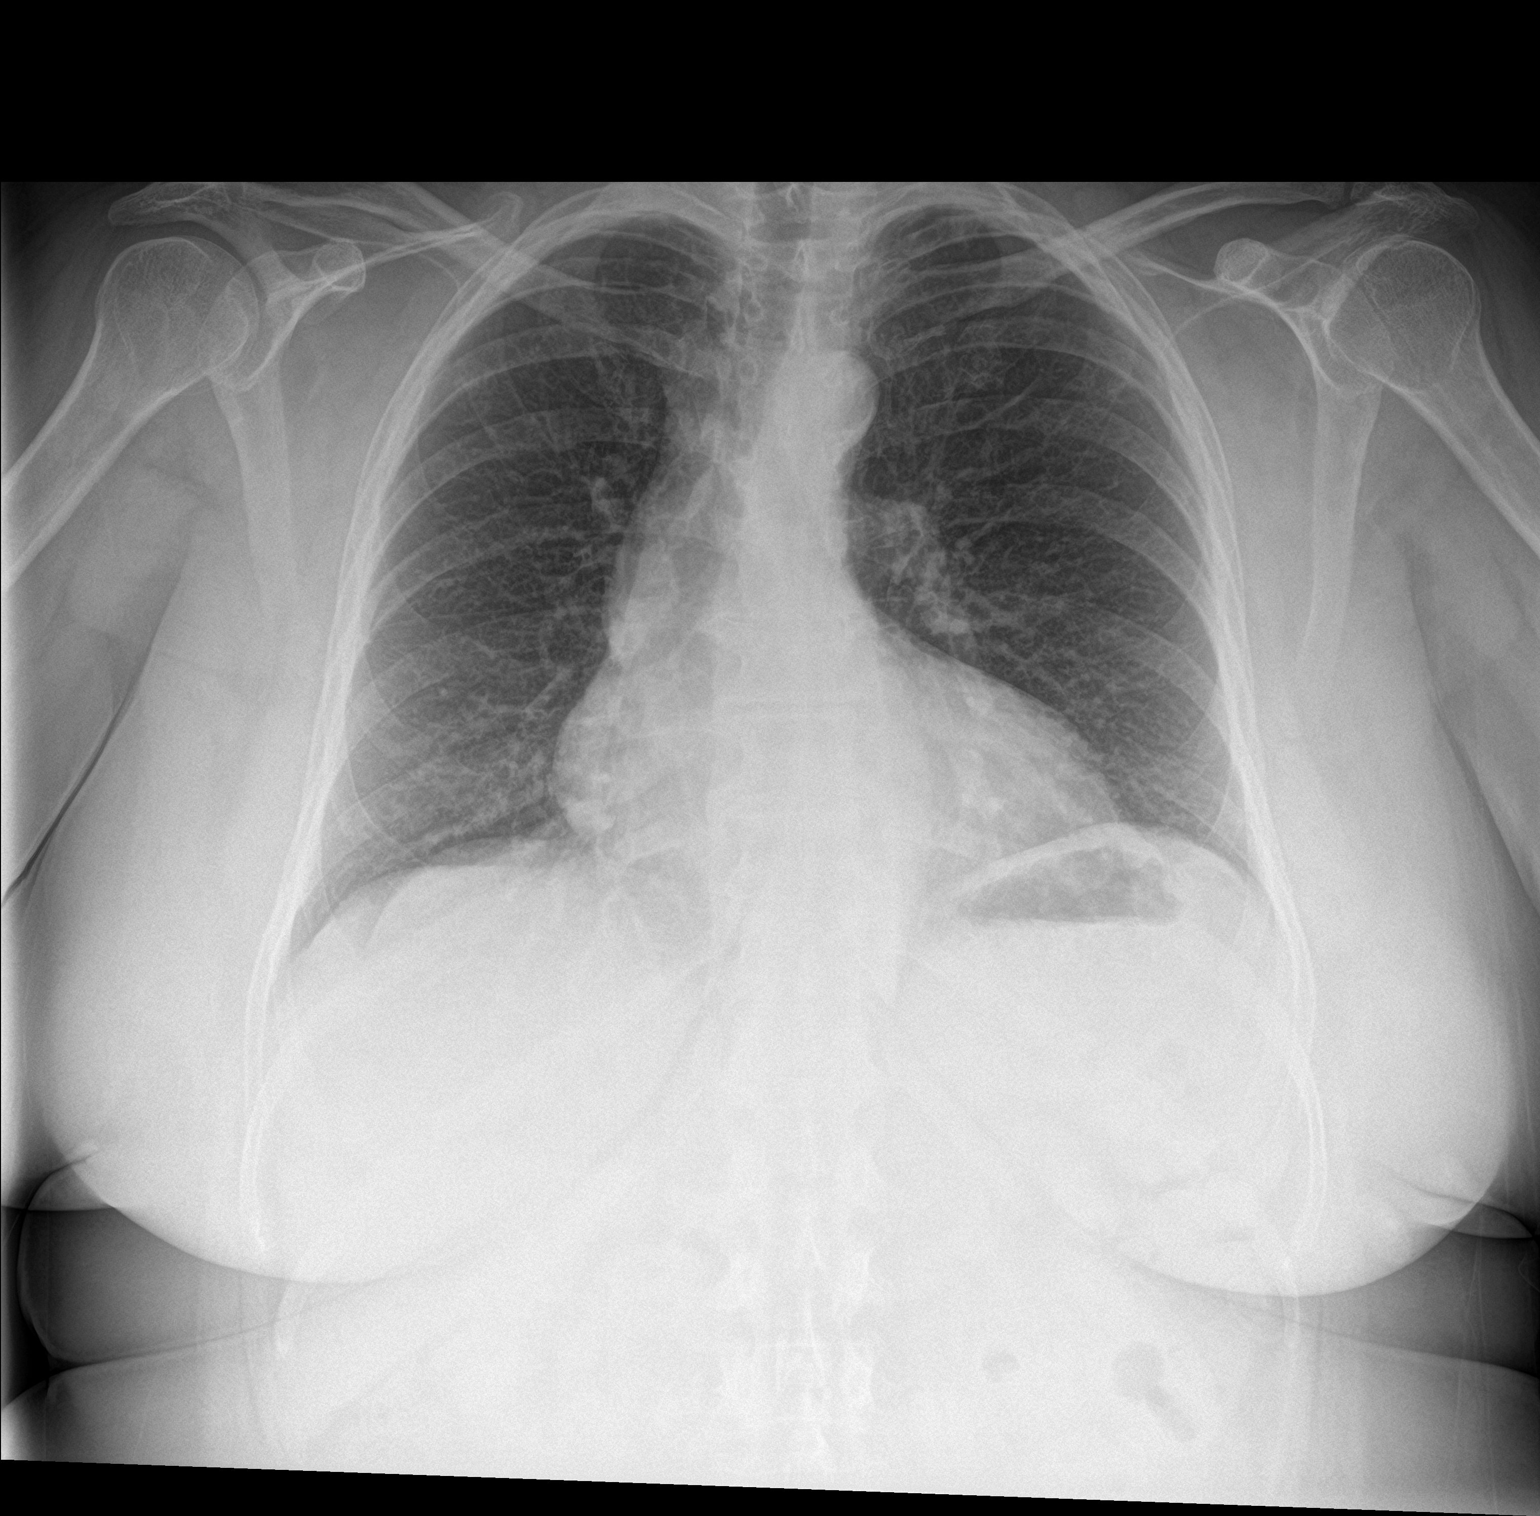

[chest lat]
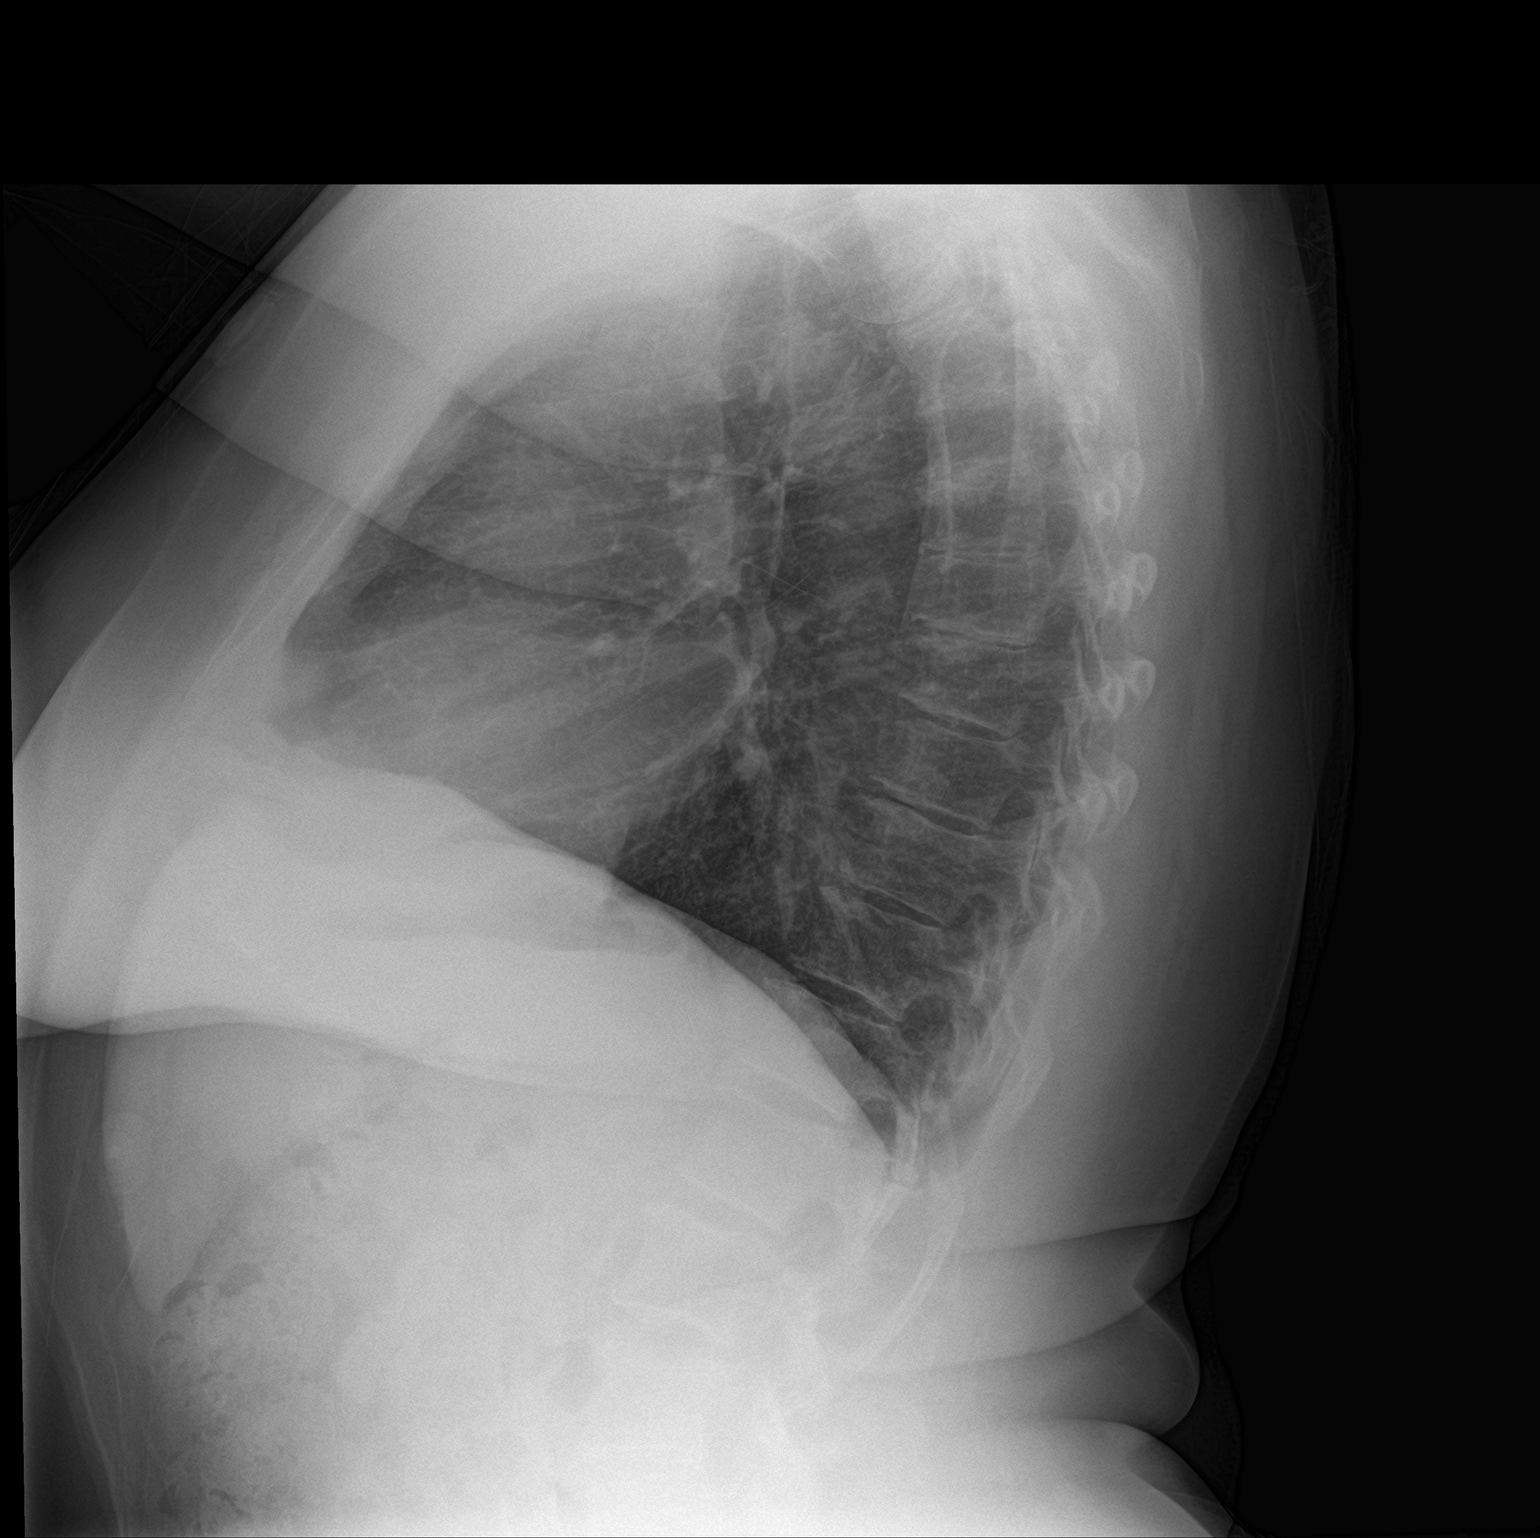

[2 of 2 positions shown; findings below may reference images not displayed]

FINDINGS: The lungs are adequately inflated and clear. The heart and pulmonary
vascularity are normal. The mediastinum is normal in width. There is
tortuosity of the ascending and descending thoracic aorta with
calcification in the wall of the aortic arch. There is mild
multilevel degenerative disc disease of the thoracic spine.
IMPRESSION: There is no pneumonia, CHF, nor other acute cardiopulmonary disease.

Thoracic aortic atherosclerosis.

## 2018-06-14 IMAGING — CR DG LUMBAR SPINE COMPLETE 4+V
5 series · 5 of 5 positions shown · non-contrast
Comparison: Chest x-ray of September 15, 2006 for purposes of vertebral
level numbering.

CLINICAL DATA: Low back pain for 10 or more years.

EXAM:
LUMBAR SPINE - COMPLETE 4+ VIEW

[t lumbar spine ap]
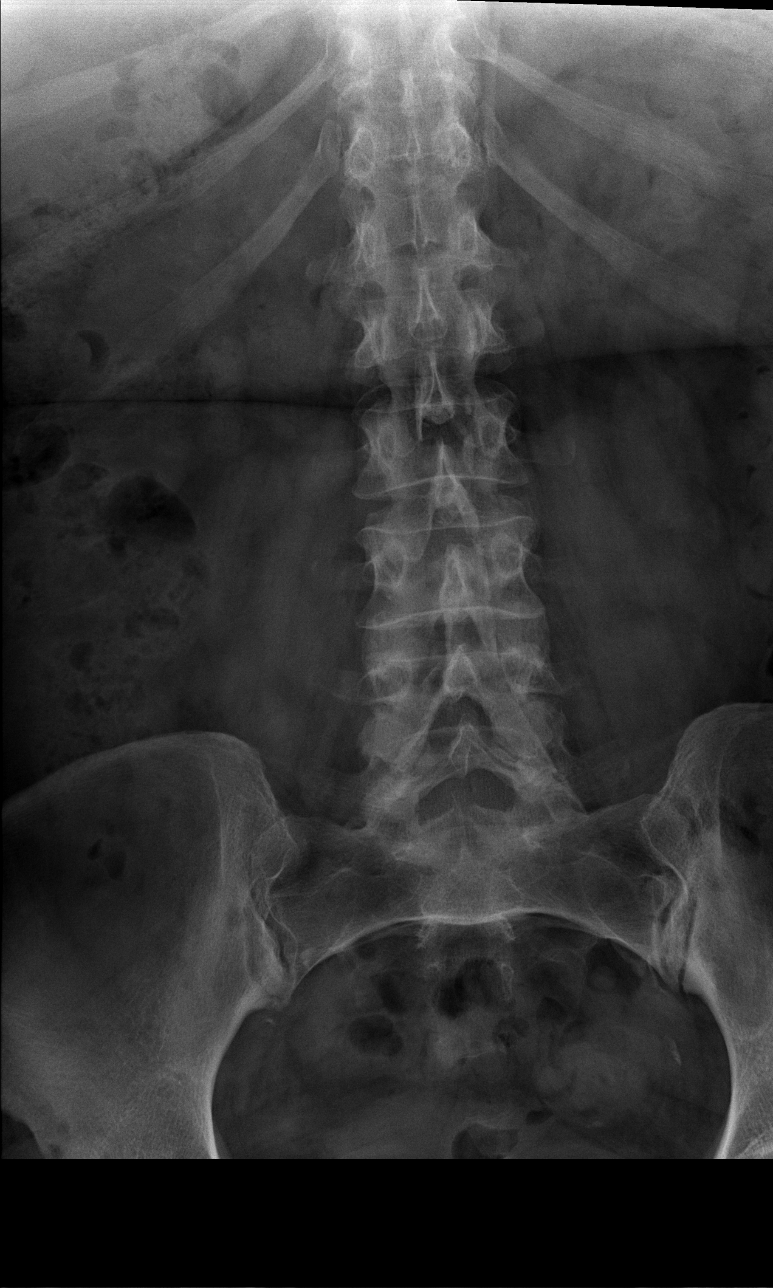

[t lumbar spine obl (1 of 2)]
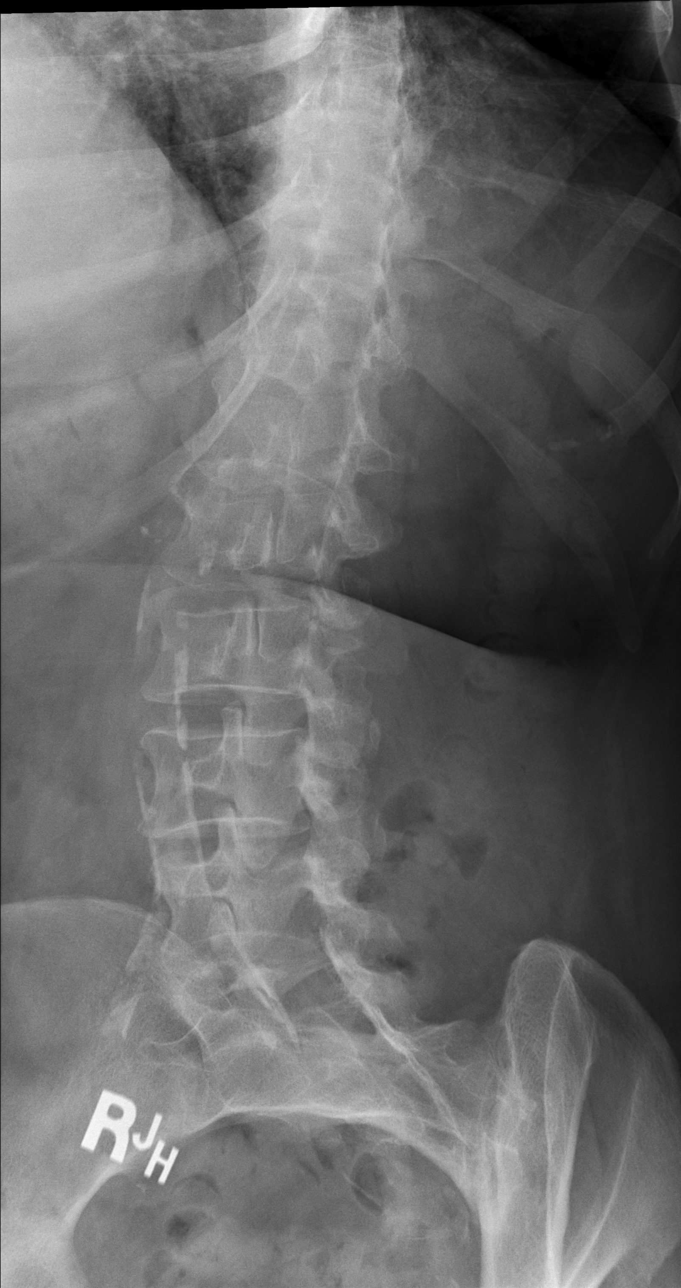

[t lumbar spine obl (2 of 2)]
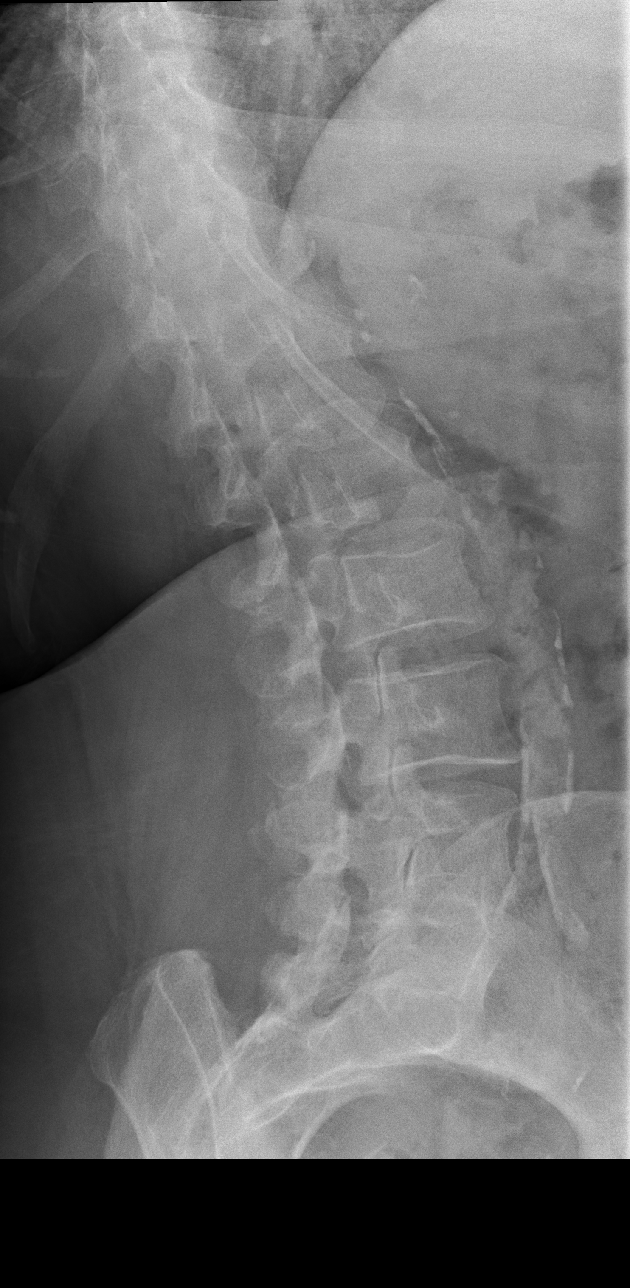

[t lumbar spine lat]
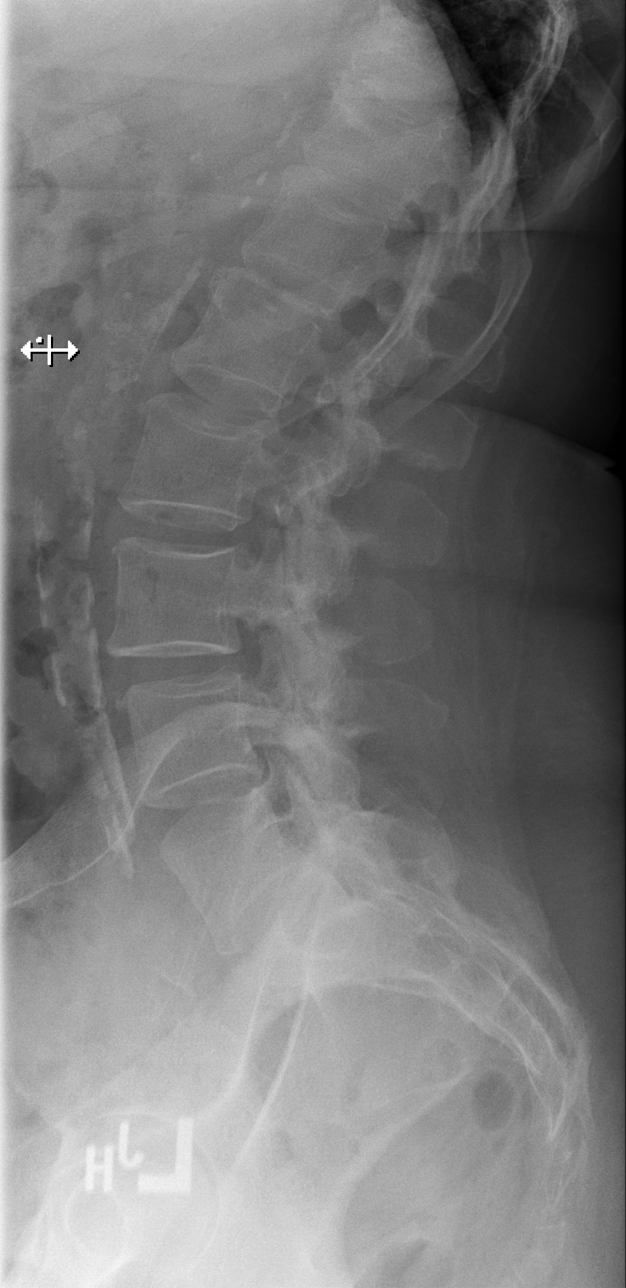

[t lumbar l-5 s-1 spot]
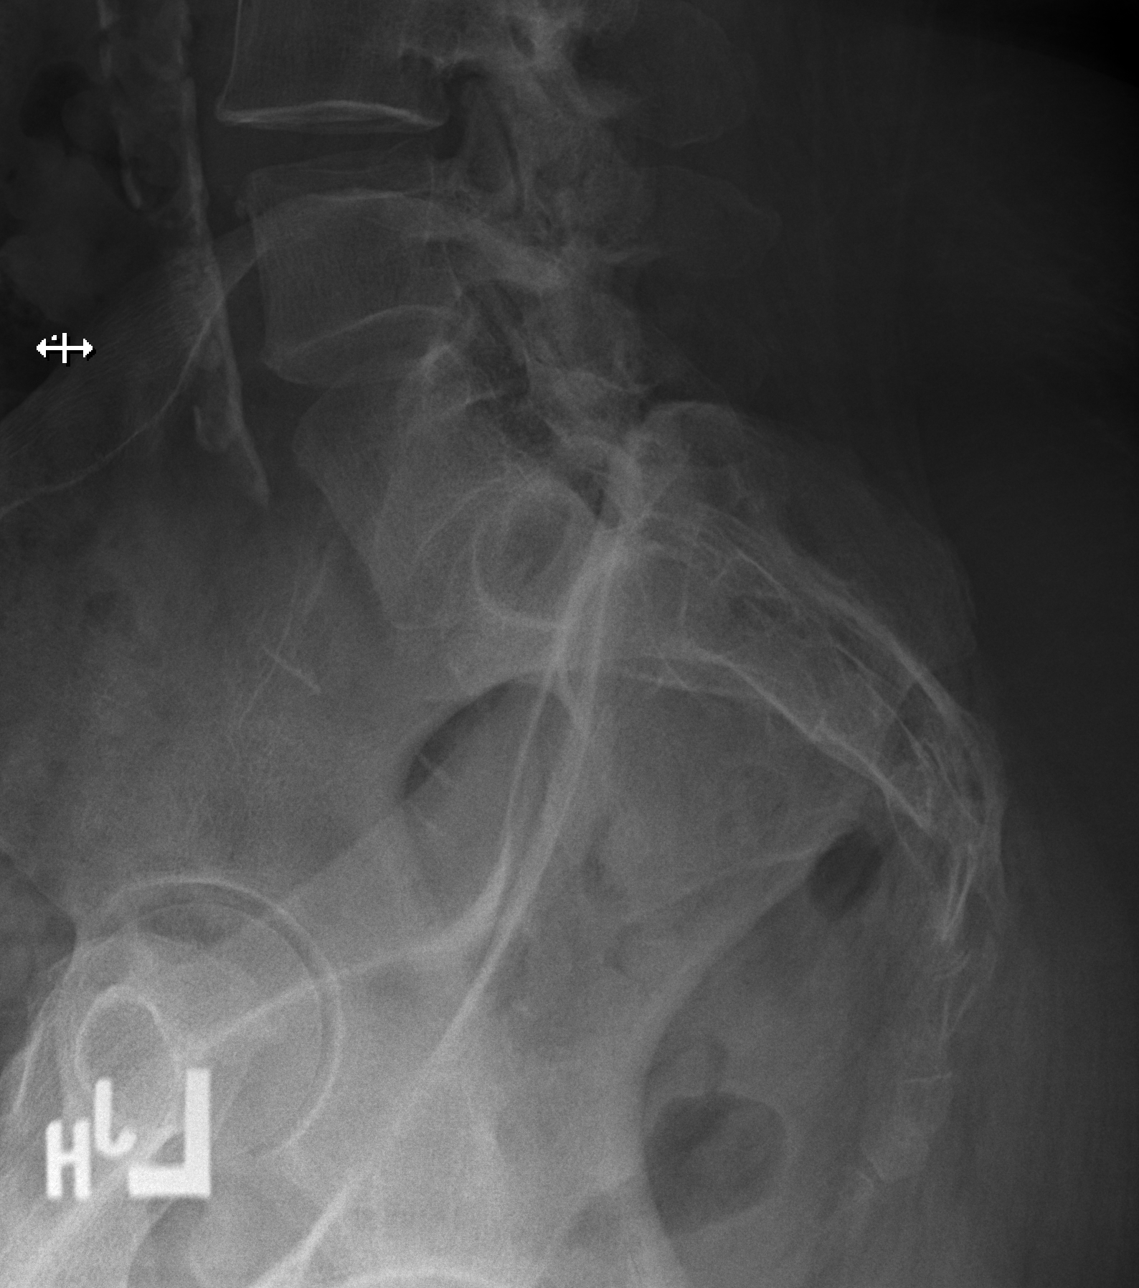

[5 of 5 positions shown; findings below may reference images not displayed]

FINDINGS: The twelfth ribs are hypoplastic. The lumbar vertebral bodies are
preserved in height. The disc space heights are well maintained.
There is no spondylolisthesis. There is facet joint hypertrophy from
L2-3 through L5-S1. The pedicles and transverse processes are
intact. There is calcification in the wall of the abdominal aorta
and common iliac arteries.
IMPRESSION: There is no acute bony abnormality of the lumbar spine. Chronic
multilevel facet joint hypertrophy. If there are radicular symptoms,
lumbar spine MRI would be a useful next imaging step.

Abdominal aortic atherosclerosis.

## 2018-12-31 ENCOUNTER — Other Ambulatory Visit: Payer: Self-pay

## 2018-12-31 DIAGNOSIS — Z20822 Contact with and (suspected) exposure to covid-19: Secondary | ICD-10-CM

## 2019-01-01 LAB — NOVEL CORONAVIRUS, NAA: SARS-CoV-2, NAA: NOT DETECTED

## 2019-01-24 ENCOUNTER — Other Ambulatory Visit: Payer: Self-pay

## 2019-01-24 DIAGNOSIS — Z20822 Contact with and (suspected) exposure to covid-19: Secondary | ICD-10-CM

## 2019-01-26 LAB — NOVEL CORONAVIRUS, NAA: SARS-CoV-2, NAA: DETECTED — AB

## 2020-10-01 ENCOUNTER — Encounter: Payer: Self-pay | Admitting: Podiatry

## 2020-10-01 ENCOUNTER — Ambulatory Visit (INDEPENDENT_AMBULATORY_CARE_PROVIDER_SITE_OTHER): Payer: 59

## 2020-10-01 ENCOUNTER — Ambulatory Visit: Payer: 59 | Admitting: Podiatry

## 2020-10-01 ENCOUNTER — Other Ambulatory Visit: Payer: Self-pay

## 2020-10-01 DIAGNOSIS — M79671 Pain in right foot: Secondary | ICD-10-CM

## 2020-10-01 DIAGNOSIS — M778 Other enthesopathies, not elsewhere classified: Secondary | ICD-10-CM

## 2020-10-01 DIAGNOSIS — M1A0790 Idiopathic chronic gout, unspecified ankle and foot, without tophus (tophi): Secondary | ICD-10-CM | POA: Diagnosis not present

## 2020-10-01 MED ORDER — METHYLPREDNISOLONE 4 MG PO TBPK: ORAL_TABLET | ORAL | 0 refills | Status: AC

## 2020-10-01 NOTE — Progress Notes (Signed)
Subjective:   Patient ID: Tiffany Porter, female   DOB: 60 y.o.   MRN: [id number]   HPI Patient presents with a lot of swelling of the top of her right foot of several days duration with no history of injury.  Does have history of gout and states that its been very tender and hard to walk on.  She has been taking allopurinol and has only had several small attacks and does not smoke likes to be active   Review of Systems  All other systems reviewed and are negative.      Objective:  Physical Exam Vitals and nursing note reviewed.  Constitutional:      Appearance: She is well-developed.  Pulmonary:     Effort: Pulmonary effort is normal.  Musculoskeletal:        General: Normal range of motion.  Skin:    General: Skin is warm.  Neurological:     Mental Status: She is alert.    Neurovascular status intact muscle strength was found to be adequate range of motion adequate with exquisite discomfort dorsum right foot with inflammation fluid of the extensor tendon complex right and no acute swelling noted associated with this.  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

## 2020-10-02 ENCOUNTER — Other Ambulatory Visit: Payer: Self-pay | Admitting: Podiatry

## 2020-10-02 DIAGNOSIS — M778 Other enthesopathies, not elsewhere classified: Secondary | ICD-10-CM

## 2021-05-20 DIAGNOSIS — I10 Essential (primary) hypertension: Secondary | ICD-10-CM | POA: Diagnosis not present

## 2021-05-20 DIAGNOSIS — E890 Postprocedural hypothyroidism: Secondary | ICD-10-CM | POA: Diagnosis not present

## 2021-05-20 DIAGNOSIS — E7800 Pure hypercholesterolemia, unspecified: Secondary | ICD-10-CM | POA: Diagnosis not present

## 2021-05-27 DIAGNOSIS — M549 Dorsalgia, unspecified: Secondary | ICD-10-CM | POA: Diagnosis not present

## 2021-05-27 DIAGNOSIS — I10 Essential (primary) hypertension: Secondary | ICD-10-CM | POA: Diagnosis not present

## 2021-05-27 DIAGNOSIS — E7800 Pure hypercholesterolemia, unspecified: Secondary | ICD-10-CM | POA: Diagnosis not present

## 2021-05-27 DIAGNOSIS — M109 Gout, unspecified: Secondary | ICD-10-CM | POA: Diagnosis not present

## 2021-06-28 DIAGNOSIS — M549 Dorsalgia, unspecified: Secondary | ICD-10-CM | POA: Diagnosis not present

## 2021-06-28 DIAGNOSIS — M79671 Pain in right foot: Secondary | ICD-10-CM | POA: Diagnosis not present

## 2021-06-28 DIAGNOSIS — S29012A Strain of muscle and tendon of back wall of thorax, initial encounter: Secondary | ICD-10-CM | POA: Diagnosis not present

## 2021-08-24 DIAGNOSIS — M5451 Vertebrogenic low back pain: Secondary | ICD-10-CM | POA: Diagnosis not present

## 2021-10-08 DIAGNOSIS — M5451 Vertebrogenic low back pain: Secondary | ICD-10-CM | POA: Diagnosis not present

## 2021-10-08 DIAGNOSIS — M5459 Other low back pain: Secondary | ICD-10-CM | POA: Diagnosis not present

## 2021-10-17 DIAGNOSIS — M5451 Vertebrogenic low back pain: Secondary | ICD-10-CM | POA: Diagnosis not present

## 2021-10-28 DIAGNOSIS — M5136 Other intervertebral disc degeneration, lumbar region: Secondary | ICD-10-CM | POA: Diagnosis not present

## 2021-10-28 DIAGNOSIS — M5451 Vertebrogenic low back pain: Secondary | ICD-10-CM | POA: Diagnosis not present

## 2021-11-17 DIAGNOSIS — E7800 Pure hypercholesterolemia, unspecified: Secondary | ICD-10-CM | POA: Diagnosis not present

## 2021-11-17 DIAGNOSIS — M109 Gout, unspecified: Secondary | ICD-10-CM | POA: Diagnosis not present

## 2021-11-17 DIAGNOSIS — E559 Vitamin D deficiency, unspecified: Secondary | ICD-10-CM | POA: Diagnosis not present

## 2021-11-17 DIAGNOSIS — E039 Hypothyroidism, unspecified: Secondary | ICD-10-CM | POA: Diagnosis not present

## 2021-11-17 DIAGNOSIS — I10 Essential (primary) hypertension: Secondary | ICD-10-CM | POA: Diagnosis not present

## 2021-11-18 DIAGNOSIS — M47816 Spondylosis without myelopathy or radiculopathy, lumbar region: Secondary | ICD-10-CM | POA: Diagnosis not present

## 2021-11-25 DIAGNOSIS — M109 Gout, unspecified: Secondary | ICD-10-CM | POA: Diagnosis not present

## 2021-11-25 DIAGNOSIS — Z0000 Encounter for general adult medical examination without abnormal findings: Secondary | ICD-10-CM | POA: Diagnosis not present

## 2021-11-25 DIAGNOSIS — I10 Essential (primary) hypertension: Secondary | ICD-10-CM | POA: Diagnosis not present

## 2021-11-25 DIAGNOSIS — E7800 Pure hypercholesterolemia, unspecified: Secondary | ICD-10-CM | POA: Diagnosis not present

## 2021-12-02 DIAGNOSIS — M5451 Vertebrogenic low back pain: Secondary | ICD-10-CM | POA: Diagnosis not present

## 2021-12-30 DIAGNOSIS — M47896 Other spondylosis, lumbar region: Secondary | ICD-10-CM | POA: Diagnosis not present

## 2021-12-30 DIAGNOSIS — M47816 Spondylosis without myelopathy or radiculopathy, lumbar region: Secondary | ICD-10-CM | POA: Diagnosis not present

## 2022-01-06 DIAGNOSIS — H16403 Unspecified corneal neovascularization, bilateral: Secondary | ICD-10-CM | POA: Diagnosis not present

## 2022-01-06 DIAGNOSIS — H5213 Myopia, bilateral: Secondary | ICD-10-CM | POA: Diagnosis not present

## 2022-01-17 DIAGNOSIS — M5136 Other intervertebral disc degeneration, lumbar region: Secondary | ICD-10-CM | POA: Diagnosis not present

## 2022-01-17 DIAGNOSIS — M5451 Vertebrogenic low back pain: Secondary | ICD-10-CM | POA: Diagnosis not present

## 2022-01-31 DIAGNOSIS — M47816 Spondylosis without myelopathy or radiculopathy, lumbar region: Secondary | ICD-10-CM | POA: Diagnosis not present

## 2022-03-03 DIAGNOSIS — M47896 Other spondylosis, lumbar region: Secondary | ICD-10-CM | POA: Diagnosis not present

## 2022-05-19 DIAGNOSIS — E039 Hypothyroidism, unspecified: Secondary | ICD-10-CM | POA: Diagnosis not present

## 2022-05-19 DIAGNOSIS — E7800 Pure hypercholesterolemia, unspecified: Secondary | ICD-10-CM | POA: Diagnosis not present

## 2022-05-19 DIAGNOSIS — I10 Essential (primary) hypertension: Secondary | ICD-10-CM | POA: Diagnosis not present

## 2022-05-26 DIAGNOSIS — I10 Essential (primary) hypertension: Secondary | ICD-10-CM | POA: Diagnosis not present

## 2022-05-26 DIAGNOSIS — M549 Dorsalgia, unspecified: Secondary | ICD-10-CM | POA: Diagnosis not present

## 2022-05-26 DIAGNOSIS — E7800 Pure hypercholesterolemia, unspecified: Secondary | ICD-10-CM | POA: Diagnosis not present

## 2022-05-26 DIAGNOSIS — M109 Gout, unspecified: Secondary | ICD-10-CM | POA: Diagnosis not present

## 2022-11-24 DIAGNOSIS — E039 Hypothyroidism, unspecified: Secondary | ICD-10-CM | POA: Diagnosis not present

## 2022-11-24 DIAGNOSIS — E7800 Pure hypercholesterolemia, unspecified: Secondary | ICD-10-CM | POA: Diagnosis not present

## 2022-11-24 DIAGNOSIS — E559 Vitamin D deficiency, unspecified: Secondary | ICD-10-CM | POA: Diagnosis not present

## 2022-11-24 DIAGNOSIS — I10 Essential (primary) hypertension: Secondary | ICD-10-CM | POA: Diagnosis not present

## 2022-12-01 DIAGNOSIS — E7800 Pure hypercholesterolemia, unspecified: Secondary | ICD-10-CM | POA: Diagnosis not present

## 2022-12-01 DIAGNOSIS — I10 Essential (primary) hypertension: Secondary | ICD-10-CM | POA: Diagnosis not present

## 2022-12-01 DIAGNOSIS — Z0000 Encounter for general adult medical examination without abnormal findings: Secondary | ICD-10-CM | POA: Diagnosis not present

## 2022-12-01 DIAGNOSIS — M109 Gout, unspecified: Secondary | ICD-10-CM | POA: Diagnosis not present

## 2022-12-28 DIAGNOSIS — E7800 Pure hypercholesterolemia, unspecified: Secondary | ICD-10-CM | POA: Diagnosis not present

## 2022-12-28 DIAGNOSIS — L659 Nonscarring hair loss, unspecified: Secondary | ICD-10-CM | POA: Diagnosis not present

## 2022-12-29 DIAGNOSIS — I10 Essential (primary) hypertension: Secondary | ICD-10-CM | POA: Diagnosis not present

## 2022-12-29 DIAGNOSIS — L659 Nonscarring hair loss, unspecified: Secondary | ICD-10-CM | POA: Diagnosis not present

## 2023-01-12 DIAGNOSIS — H52203 Unspecified astigmatism, bilateral: Secondary | ICD-10-CM | POA: Diagnosis not present

## 2023-01-12 DIAGNOSIS — H5213 Myopia, bilateral: Secondary | ICD-10-CM | POA: Diagnosis not present

## 2023-01-12 DIAGNOSIS — H16203 Unspecified keratoconjunctivitis, bilateral: Secondary | ICD-10-CM | POA: Diagnosis not present

## 2023-01-12 DIAGNOSIS — H16403 Unspecified corneal neovascularization, bilateral: Secondary | ICD-10-CM | POA: Diagnosis not present

## 2023-01-12 DIAGNOSIS — H1820 Unspecified corneal edema: Secondary | ICD-10-CM | POA: Diagnosis not present

## 2023-02-09 DIAGNOSIS — H16223 Keratoconjunctivitis sicca, not specified as Sjogren's, bilateral: Secondary | ICD-10-CM | POA: Diagnosis not present

## 2023-02-09 DIAGNOSIS — H25813 Combined forms of age-related cataract, bilateral: Secondary | ICD-10-CM | POA: Diagnosis not present

## 2023-02-09 DIAGNOSIS — H18603 Keratoconus, unspecified, bilateral: Secondary | ICD-10-CM | POA: Diagnosis not present

## 2023-02-22 DIAGNOSIS — I10 Essential (primary) hypertension: Secondary | ICD-10-CM | POA: Diagnosis not present

## 2023-02-23 DIAGNOSIS — I10 Essential (primary) hypertension: Secondary | ICD-10-CM | POA: Diagnosis not present

## 2023-02-23 DIAGNOSIS — E039 Hypothyroidism, unspecified: Secondary | ICD-10-CM | POA: Diagnosis not present

## 2023-02-23 DIAGNOSIS — M549 Dorsalgia, unspecified: Secondary | ICD-10-CM | POA: Diagnosis not present

## 2023-02-23 DIAGNOSIS — L659 Nonscarring hair loss, unspecified: Secondary | ICD-10-CM | POA: Diagnosis not present

## 2023-04-19 DIAGNOSIS — E7800 Pure hypercholesterolemia, unspecified: Secondary | ICD-10-CM | POA: Diagnosis not present

## 2023-04-19 DIAGNOSIS — I10 Essential (primary) hypertension: Secondary | ICD-10-CM | POA: Diagnosis not present

## 2023-04-19 DIAGNOSIS — E039 Hypothyroidism, unspecified: Secondary | ICD-10-CM | POA: Diagnosis not present

## 2023-05-03 DIAGNOSIS — H2513 Age-related nuclear cataract, bilateral: Secondary | ICD-10-CM | POA: Diagnosis not present

## 2023-05-03 DIAGNOSIS — H04123 Dry eye syndrome of bilateral lacrimal glands: Secondary | ICD-10-CM | POA: Diagnosis not present

## 2023-05-10 DIAGNOSIS — E039 Hypothyroidism, unspecified: Secondary | ICD-10-CM | POA: Diagnosis not present

## 2023-05-10 DIAGNOSIS — M549 Dorsalgia, unspecified: Secondary | ICD-10-CM | POA: Diagnosis not present

## 2023-05-10 DIAGNOSIS — I10 Essential (primary) hypertension: Secondary | ICD-10-CM | POA: Diagnosis not present

## 2023-05-10 DIAGNOSIS — L659 Nonscarring hair loss, unspecified: Secondary | ICD-10-CM | POA: Diagnosis not present

## 2023-09-06 DIAGNOSIS — E039 Hypothyroidism, unspecified: Secondary | ICD-10-CM | POA: Diagnosis not present

## 2023-09-06 DIAGNOSIS — E7800 Pure hypercholesterolemia, unspecified: Secondary | ICD-10-CM | POA: Diagnosis not present

## 2023-09-06 DIAGNOSIS — E559 Vitamin D deficiency, unspecified: Secondary | ICD-10-CM | POA: Diagnosis not present

## 2023-09-06 DIAGNOSIS — M109 Gout, unspecified: Secondary | ICD-10-CM | POA: Diagnosis not present

## 2023-09-13 DIAGNOSIS — M549 Dorsalgia, unspecified: Secondary | ICD-10-CM | POA: Diagnosis not present

## 2023-09-13 DIAGNOSIS — L659 Nonscarring hair loss, unspecified: Secondary | ICD-10-CM | POA: Diagnosis not present

## 2023-09-13 DIAGNOSIS — I10 Essential (primary) hypertension: Secondary | ICD-10-CM | POA: Diagnosis not present

## 2023-09-13 DIAGNOSIS — E039 Hypothyroidism, unspecified: Secondary | ICD-10-CM | POA: Diagnosis not present

## 2023-12-07 DIAGNOSIS — E039 Hypothyroidism, unspecified: Secondary | ICD-10-CM | POA: Diagnosis not present

## 2023-12-07 DIAGNOSIS — N1832 Chronic kidney disease, stage 3b: Secondary | ICD-10-CM | POA: Diagnosis not present

## 2023-12-07 DIAGNOSIS — E7800 Pure hypercholesterolemia, unspecified: Secondary | ICD-10-CM | POA: Diagnosis not present

## 2023-12-07 DIAGNOSIS — I10 Essential (primary) hypertension: Secondary | ICD-10-CM | POA: Diagnosis not present

## 2023-12-13 DIAGNOSIS — Z0000 Encounter for general adult medical examination without abnormal findings: Secondary | ICD-10-CM | POA: Diagnosis not present

## 2023-12-13 DIAGNOSIS — I10 Essential (primary) hypertension: Secondary | ICD-10-CM | POA: Diagnosis not present

## 2023-12-13 DIAGNOSIS — L659 Nonscarring hair loss, unspecified: Secondary | ICD-10-CM | POA: Diagnosis not present

## 2023-12-13 DIAGNOSIS — M79672 Pain in left foot: Secondary | ICD-10-CM | POA: Diagnosis not present

## 2024-01-07 DIAGNOSIS — Z1212 Encounter for screening for malignant neoplasm of rectum: Secondary | ICD-10-CM | POA: Diagnosis not present

## 2024-03-21 DIAGNOSIS — M7520 Bicipital tendinitis, unspecified shoulder: Secondary | ICD-10-CM | POA: Diagnosis not present

## 2024-03-21 DIAGNOSIS — M7022 Olecranon bursitis, left elbow: Secondary | ICD-10-CM | POA: Diagnosis not present

## 2024-04-03 DIAGNOSIS — M7020 Olecranon bursitis, unspecified elbow: Secondary | ICD-10-CM | POA: Diagnosis not present

## 2024-04-03 DIAGNOSIS — M25522 Pain in left elbow: Secondary | ICD-10-CM | POA: Diagnosis not present

## 2024-04-03 DIAGNOSIS — N1832 Chronic kidney disease, stage 3b: Secondary | ICD-10-CM | POA: Diagnosis not present

## 2024-04-03 DIAGNOSIS — M109 Gout, unspecified: Secondary | ICD-10-CM | POA: Diagnosis not present
